# Patient Record
Sex: Male | Born: 1975
Health system: Western US, Academic
[De-identification: ages and names within clinical notes are randomized; demographics above are authoritative.]

## PROBLEM LIST (undated history)

## (undated) DIAGNOSIS — F4310 Post-traumatic stress disorder, unspecified: Secondary | ICD-10-CM

## (undated) HISTORY — PX: HYDROCELE EXCISION / REPAIR: SUR1145

## (undated) HISTORY — PX: EYE SURGERY: SHX253

---

## 1999-10-20 ENCOUNTER — Encounter: Admission: RE | Admit: 1999-10-20 | Discharge: 1999-10-20 | Payer: Self-pay | Admitting: General Surgery

## 1999-10-20 ENCOUNTER — Encounter: Payer: Self-pay | Admitting: General Surgery

## 2003-04-02 ENCOUNTER — Emergency Department (HOSPITAL_COMMUNITY): Admission: EM | Admit: 2003-04-02 | Discharge: 2003-04-02 | Payer: Self-pay | Admitting: Emergency Medicine

## 2010-05-31 ENCOUNTER — Emergency Department (INDEPENDENT_AMBULATORY_CARE_PROVIDER_SITE_OTHER): Payer: Self-pay

## 2010-05-31 ENCOUNTER — Emergency Department (HOSPITAL_BASED_OUTPATIENT_CLINIC_OR_DEPARTMENT_OTHER)
Admission: EM | Admit: 2010-05-31 | Discharge: 2010-05-31 | Disposition: A | Discharge status: Home / Self Care | Payer: Self-pay | Attending: Emergency Medicine | Admitting: Emergency Medicine

## 2010-05-31 DIAGNOSIS — R0602 Shortness of breath: Secondary | ICD-10-CM | POA: Insufficient documentation

## 2010-05-31 DIAGNOSIS — F17200 Nicotine dependence, unspecified, uncomplicated: Secondary | ICD-10-CM | POA: Insufficient documentation

## 2010-05-31 DIAGNOSIS — R112 Nausea with vomiting, unspecified: Secondary | ICD-10-CM | POA: Insufficient documentation

## 2010-05-31 DIAGNOSIS — R059 Cough, unspecified: Secondary | ICD-10-CM | POA: Insufficient documentation

## 2010-05-31 DIAGNOSIS — R05 Cough: Secondary | ICD-10-CM

## 2010-05-31 DIAGNOSIS — R1110 Vomiting, unspecified: Secondary | ICD-10-CM

## 2010-05-31 DIAGNOSIS — R197 Diarrhea, unspecified: Secondary | ICD-10-CM | POA: Insufficient documentation

## 2010-05-31 LAB — BASIC METABOLIC PANEL
BUN: 17 mg/dL (ref 6–23)
CO2: 22 mEq/L (ref 19–32)
Calcium: 9 mg/dL (ref 8.4–10.5)
Chloride: 108 mEq/L (ref 96–112)
Creatinine, Ser: 1.1 mg/dL (ref 0.4–1.5)
GFR calc Af Amer: >60 mL/min (ref >60)
GFR calc non Af Amer: >60 mL/min (ref >60)
Glucose, Bld: 93 mg/dL (ref 70–99)
Potassium: 4.4 mEq/L (ref 3.5–5.1)
Sodium: 145 mEq/L (ref 135–145)

## 2010-12-26 ENCOUNTER — Other Ambulatory Visit: Payer: Self-pay | Admitting: Emergency Medicine

## 2010-12-26 ENCOUNTER — Ambulatory Visit
Admission: RE | Admit: 2010-12-26 | Discharge: 2010-12-26 | Disposition: A | Discharge status: Home / Self Care | Payer: 59 | Source: Ambulatory Visit | Attending: Emergency Medicine | Admitting: Emergency Medicine

## 2010-12-26 DIAGNOSIS — R52 Pain, unspecified: Secondary | ICD-10-CM

## 2011-01-26 ENCOUNTER — Other Ambulatory Visit: Payer: Self-pay | Admitting: Nephrology

## 2011-01-26 ENCOUNTER — Ambulatory Visit
Admission: RE | Admit: 2011-01-26 | Discharge: 2011-01-26 | Disposition: A | Discharge status: Home / Self Care | Payer: 59 | Source: Ambulatory Visit | Attending: Nephrology | Admitting: Nephrology

## 2011-01-26 DIAGNOSIS — N179 Acute kidney failure, unspecified: Secondary | ICD-10-CM

## 2011-08-11 ENCOUNTER — Telehealth: Payer: Self-pay

## 2011-08-14 NOTE — Telephone Encounter (Signed)
Pt wife states that pt had recently requested his medical records, she states that while looking through those records she did not see the pt's out of work notes between the months of November-December, 2012. Pt really needs those out of work notes for his unemployment meeting on Monday. [phone]

## 2011-08-15 NOTE — Telephone Encounter (Signed)
Notified pt that we only have one out of work note and that the other notes should come from specialists since they took him out of work

## 2014-12-26 ENCOUNTER — Emergency Department (HOSPITAL_BASED_OUTPATIENT_CLINIC_OR_DEPARTMENT_OTHER)
Admission: EM | Admit: 2014-12-26 | Discharge: 2014-12-26 | Disposition: A | Discharge status: Home / Self Care | Payer: Self-pay | Attending: Emergency Medicine | Admitting: Emergency Medicine

## 2014-12-26 ENCOUNTER — Encounter (HOSPITAL_BASED_OUTPATIENT_CLINIC_OR_DEPARTMENT_OTHER): Payer: Self-pay | Admitting: *Deleted

## 2014-12-26 ENCOUNTER — Emergency Department (HOSPITAL_BASED_OUTPATIENT_CLINIC_OR_DEPARTMENT_OTHER): Payer: Self-pay

## 2014-12-26 DIAGNOSIS — S56129A Laceration of flexor muscle, fascia and tendon of unspecified finger at forearm level, initial encounter: Secondary | ICD-10-CM

## 2014-12-26 DIAGNOSIS — F17210 Nicotine dependence, cigarettes, uncomplicated: Secondary | ICD-10-CM | POA: Insufficient documentation

## 2014-12-26 DIAGNOSIS — Y9389 Activity, other specified: Secondary | ICD-10-CM | POA: Insufficient documentation

## 2014-12-26 DIAGNOSIS — Y9289 Other specified places as the place of occurrence of the external cause: Secondary | ICD-10-CM | POA: Insufficient documentation

## 2014-12-26 DIAGNOSIS — W540XXA Bitten by dog, initial encounter: Secondary | ICD-10-CM | POA: Insufficient documentation

## 2014-12-26 DIAGNOSIS — Y998 Other external cause status: Secondary | ICD-10-CM | POA: Insufficient documentation

## 2014-12-26 DIAGNOSIS — S62632B Displaced fracture of distal phalanx of right middle finger, initial encounter for open fracture: Secondary | ICD-10-CM

## 2014-12-26 DIAGNOSIS — Z23 Encounter for immunization: Secondary | ICD-10-CM | POA: Insufficient documentation

## 2014-12-26 DIAGNOSIS — S61209A Unspecified open wound of unspecified finger without damage to nail, initial encounter: Secondary | ICD-10-CM

## 2014-12-26 DIAGNOSIS — S62662B Nondisplaced fracture of distal phalanx of right middle finger, initial encounter for open fracture: Secondary | ICD-10-CM | POA: Insufficient documentation

## 2014-12-26 DIAGNOSIS — S66122A Laceration of flexor muscle, fascia and tendon of right middle finger at wrist and hand level, initial encounter: Secondary | ICD-10-CM | POA: Insufficient documentation

## 2014-12-26 DIAGNOSIS — Z8659 Personal history of other mental and behavioral disorders: Secondary | ICD-10-CM | POA: Insufficient documentation

## 2014-12-26 HISTORY — DX: Post-traumatic stress disorder, unspecified: F43.10

## 2014-12-26 LAB — CBC WITH DIFFERENTIAL/PLATELET
BASOS ABS: 0 10*3/uL (ref 0.0–0.1)
BASOS PCT: 0 %
EOS PCT: 1 %
Eosinophils Absolute: 0.1 10*3/uL (ref 0.0–0.7)
HEMATOCRIT: 46.1 % (ref 39.0–52.0)
Hemoglobin: 15.7 g/dL (ref 13.0–17.0)
Lymphocytes Relative: 12 %
Lymphs Abs: 1.9 10*3/uL (ref 0.7–4.0)
MCH: 31 pg (ref 26.0–34.0)
MCHC: 34.1 g/dL (ref 30.0–36.0)
MCV: 90.9 fL (ref 78.0–100.0)
MONO ABS: 1.4 10*3/uL — AB (ref 0.1–1.0)
Monocytes Relative: 9 %
NEUTROS ABS: 12.2 10*3/uL — AB (ref 1.7–7.7)
Neutrophils Relative %: 78 %
PLATELETS: 297 10*3/uL (ref 150–400)
RBC: 5.07 MIL/uL (ref 4.22–5.81)
RDW: 13.4 % (ref 11.5–15.5)
WBC: 15.5 10*3/uL — ABNORMAL HIGH (ref 4.0–10.5)

## 2014-12-26 LAB — BASIC METABOLIC PANEL
ANION GAP: 9 (ref 5–15)
BUN: 11 mg/dL (ref 6–20)
CALCIUM: 9.5 mg/dL (ref 8.9–10.3)
CO2: 23 mmol/L (ref 22–32)
Chloride: 108 mmol/L (ref 101–111)
Creatinine, Ser: 1.18 mg/dL (ref 0.61–1.24)
GFR calc Af Amer: >60 mL/min (ref >60)
GLUCOSE: 93 mg/dL (ref 65–99)
Potassium: 4.2 mmol/L (ref 3.5–5.1)
Sodium: 140 mmol/L (ref 135–145)

## 2014-12-26 MED ORDER — AMOXICILLIN-POT CLAVULANATE 875-125 MG PO TABS: 1.0000 | ORAL_TABLET | Freq: Two times a day (BID) | ORAL | Status: DC

## 2014-12-26 MED ORDER — HYDROCODONE-ACETAMINOPHEN 5-325 MG PO TABS: 1.0000 | ORAL_TABLET | ORAL | Status: AC | PRN

## 2014-12-26 MED ORDER — AMOXICILLIN-POT CLAVULANATE 875-125 MG PO TABS
1.0000 | ORAL_TABLET | Freq: Once | ORAL | Status: DC
  Filled 2014-12-26: qty 1

## 2014-12-26 MED ORDER — MORPHINE SULFATE (PF) 4 MG/ML IV SOLN
8.0000 mg | Freq: Once | INTRAVENOUS | Status: AC
  Administered 2014-12-26: 8 mg via INTRAVENOUS
  Filled 2014-12-26: qty 2

## 2014-12-26 MED ORDER — SODIUM CHLORIDE 0.9 % IV SOLN
3.0000 g | Freq: Once | INTRAVENOUS | Status: AC
  Administered 2014-12-26: 3 g via INTRAVENOUS
  Filled 2014-12-26: qty 3

## 2014-12-26 MED ORDER — TETANUS-DIPHTH-ACELL PERTUSSIS 5-2.5-18.5 LF-MCG/0.5 IM SUSP
0.5000 mL | Freq: Once | INTRAMUSCULAR | Status: AC
  Administered 2014-12-26: 0.5 mL via INTRAMUSCULAR
  Filled 2014-12-26: qty 0.5

## 2014-12-26 NOTE — Discharge Instructions (Signed)
Finger Fracture Fractures of fingers are breaks in the bones of the fingers. There are many types of fractures. There are different ways of treating these fractures. Your health care provider will discuss the best way to treat your fracture. CAUSES Traumatic injury is the main cause of broken fingers. These include:  Injuries while playing sports.  Workplace injuries.  Falls. RISK FACTORS Activities that can increase your risk of finger fractures include:  Sports.  Workplace activities that involve machinery.  A condition called osteoporosis, which can make your bones less dense and cause them to fracture more easily. SIGNS AND SYMPTOMS The main symptoms of a broken finger are pain and swelling within 15 minutes after the injury. Other symptoms include:  Bruising of your finger.  Stiffness of your finger.  Numbness of your finger.  Exposed bones (compound fracture) if the fracture is severe. DIAGNOSIS  The best way to diagnose a broken bone is with X-ray imaging. Additionally, your health care provider will use this X-ray image to evaluate the position of the broken finger bones.  TREATMENT  Finger fractures can be treated with:   Nonreduction--This means the bones are in place. The finger is splinted without changing the positions of the bone pieces. The splint is usually left on for about a week to 10 days. This will depend on your fracture and what your health care provider thinks.  Closed reduction--The bones are put back into position without using surgery. The finger is then splinted.  Open reduction and internal fixation--The fracture site is opened. Then the bone pieces are fixed into place with pins or some type of hardware. This is seldom required. It depends on the severity of the fracture. HOME CARE INSTRUCTIONS   Follow your health care provider's instructions regarding activities, exercises, and physical therapy.  Only take over-the-counter or prescription  medicines for pain, discomfort, or fever as directed by your health care provider. SEEK MEDICAL CARE IF: You have pain or swelling that limits the motion or use of your fingers. SEEK IMMEDIATE MEDICAL CARE IF:  Your finger becomes numb. MAKE SURE YOU:   Understand these instructions.  Will watch your condition.  Will get help right away if you are not doing well or get worse.   This information is not intended to replace advice given to you by your health care provider. Make sure you discuss any questions you have with your health care provider.   Document Released: 05/10/2000 Document Revised: 11/16/2012 Document Reviewed: 09/07/2012 Elsevier Interactive Patient Education 2016 [employer] IT sales professionalAnimal Bite Animal bites can range from mild to serious. An animal bite can result in a scratch on the skin, a deep open cut, a puncture of the skin, a crush injury, or tearing away of the skin or a body part. A small bite from a house pet will usually not cause serious problems. However, some animal bites can become infected or injure a bone or other tissue.  Bites from certain animals can be more dangerous because of the risk of spreading rabies, which is a serious viral infection. This risk is higher with bites from stray animals or wild animals, such as raccoons, foxes, skunks, and bats. Dogs are responsible for most animal bites. Children are bitten more often than adults. SYMPTOMS  Common symptoms of an animal bite include:   Pain.   Bleeding.   Swelling.   Bruising.  DIAGNOSIS  This condition may be diagnosed based on a physical exam and medical history. Your health care provider  will examine the wound and ask for details about the animal and how the bite happened. You may also have tests, such as:   Blood tests to check for infection or to determine if surgery is needed.  X-rays to check for damage to bones or joints.  Culture test. This uses a sample of fluid from the wound to  check for infection. TREATMENT  Treatment varies depending on the location and type of animal bite and your medical history. Treatment may include:   Wound care. This often includes cleaning the wound, flushing the wound with saline solution, and applying a bandage (dressing). Sometimes, the wound is left open to heal because of the high risk of infection. However, in some cases, the wound may be closed with stitches (sutures), staples, skin glue, or adhesive strips.   Antibiotic medicine.   Tetanus shot.   Rabies treatment if the animal could have rabies.  In some cases, bites that have become infected may require IV antibiotics and surgical treatment in the hospital.  HOME CARE INSTRUCTIONS Wound Care  Follow instructions from your health care provider about how to take care of your wound. Make sure you:  Wash your hands with soap and water before you change your dressing. If soap and water are not available, use hand sanitizer.  Change your dressing as told by your health care provider.  Leave sutures, skin glue, or adhesive strips in place. These skin closures may need to be in place for 2 weeks or longer. If adhesive strip edges start to loosen and curl up, you may trim the loose edges. Do not remove adhesive strips completely unless your health care provider tells you to do that.  Check your wound every day for signs of infection. Watch for:   Increasing redness, swelling, or pain.   Fluid, blood, or pus.  General Instructions  Take or apply over-the-counter and prescription medicines only as told by your health care provider.   If you were prescribed an antibiotic, take or apply it as told by your health care provider. Do not stop using the antibiotic even if your condition improves.   Keep the injured area raised (elevated) above the level of your heart while you are sitting or lying down, if this is possible.   If directed, apply ice to the injured area.    Put ice in a plastic bag.   Place a towel between your skin and the bag.   Leave the ice on for 20 minutes, 2-3 times per day.   Keep all follow-up visits as told by your health care provider. This is important.  SEEK MEDICAL CARE IF:  You have increasing redness, swelling, or pain at the site of your wound.   You have a general feeling of sickness (malaise).   You feel nauseous or you vomit.   You have pain that does not get better.  SEEK IMMEDIATE MEDICAL CARE IF:  You have a red streak extending away from your wound.   You have fluid, blood, or pus coming from your wound.   You have a fever or chills.   You have trouble moving your injured area.   You have numbness or tingling extending beyond the wound.   This information is not intended to replace advice given to you by your health care provider. Make sure you discuss any questions you have with your health care provider.   Document Released: 10/14/2010 Document Revised: 10/17/2014 Document Reviewed: 06/13/2014 Elsevier Interactive Patient Education 2016  Elsevier Inc. ° °

## 2014-12-26 NOTE — ED Provider Notes (Signed)
CSN: [id number]     Arrival date & time 12/26/14  1504 History   First MD Initiated Contact with Patient 12/26/14 1538     Chief Complaint  Patient presents with  . Animal Bite     (Consider location/radiation/quality/duration/timing/severity/associated sxs/prior Treatment) Patient is a 39 y.o. male presenting with animal bite. The history is provided by the patient.  Animal Bite Contact animal:  Dog Location:  Finger Finger injury location:  R index finger, R long finger and R ring finger Time since incident:  1 hour Pain details:    Quality:  Aching   Severity:  Moderate   Timing:  Constant   Progression:  Unchanged Incident location:  Home Provoked: provoked   Notifications:  None Animal's rabies vaccination status:  Up to date Animal in possession: yes   Tetanus status: 6+ years. Relieved by:  Nothing Worsened by:  Nothing tried Associated symptoms: no numbness and no swelling     Past Medical History  Diagnosis Date  . PTSD (post-traumatic stress disorder)    Past Surgical History  Procedure Laterality Date  . Eye surgery    . Hydrocele excision / repair     No family history on file. Social History  Substance Use Topics  . Smoking status: Current Every Day Smoker -- 0.50 packs/day    Types: Cigarettes  . Smokeless tobacco: Never Used  . Alcohol Use: No    Review of Systems  Skin: Positive for wound.  Neurological: Negative for numbness.  All other systems reviewed and are negative.     Allergies  Review of patient's allergies indicates no known allergies.  Home Medications   Prior to Admission medications   Not on File   BP 119/70 mmHg  Pulse 89  Temp(Src) 97.6 F (36.4 C) (Oral)  Resp 18  Ht 5\' 11"  (1.803 m)  Wt 165 lb (74.844 kg)  BMI 23.02 kg/m2  SpO2 100% Physical Exam  Constitutional: He is oriented to person, place, and time. He appears well-developed and well-nourished. No distress.  HENT:  Head: Normocephalic and atraumatic.   Eyes: Conjunctivae are normal.  Neck: Neck supple. No tracheal deviation present.  Cardiovascular: Normal rate and regular rhythm.   Pulmonary/Chest: Effort normal. No respiratory distress.  Abdominal: Soft. He exhibits no distension.  Musculoskeletal:       Right hand: He exhibits decreased range of motion (unable to flex at DIP of 3rd digit), tenderness and laceration ( ulnar ring, long, and index finger at DIP. palmar portion of DIP and distal phalanx of long finger).  Neurological: He is alert and oriented to person, place, and time.  Skin: Skin is warm and dry.  Psychiatric: He has a normal mood and affect.  Vitals reviewed.     ED Course  Procedures (including critical care time) Labs Review Labs Reviewed  CBC WITH DIFFERENTIAL/PLATELET - Abnormal; Notable for the following:    WBC 15.5 (*)    Neutro Abs 12.2 (*)    Monocytes Absolute 1.4 (*)    All other components within normal limits  BASIC METABOLIC PANEL    Imaging Review Dg Hand Complete Right  12/26/2014  CLINICAL DATA:  Dog bite. EXAM: RIGHT HAND - COMPLETE 3+ VIEW COMPARISON:  None. FINDINGS: Nondisplaced transverse fracture is seen involving the distal portion of the third distal phalanx. Soft tissue irregularity is noted in this area consistent with laceration. No radiopaque foreign body is noted. Joint spaces are intact. IMPRESSION: Nondisplaced fracture involving the distal portion of the  third distal phalanx. There is associated soft tissue irregularity consistent with laceration. No radiopaque foreign body is noted. Electronically Signed   By: Lupita Raider, M.D.   On: 12/26/2014 16:24   I have personally reviewed and evaluated these images and lab results as part of my medical decision-making.   EKG Interpretation None      MDM   Final diagnoses:  Open fracture of distal phalanx of third finger of right hand, initial encounter  Dog bite  Flexor tendon laceration, finger, open wound, initial  encounter    39 year old male presents after sustaining a dog bite to his right index, long, and ring fingers while trying to separate 2 of his dogs that were in a fight. The vaccinations are up-to-date and they are in possession. Patient's last tetanus was greater than 6 years ago and unsure about last administration so given a booster today. Lacerations over the pad of the right long finger and overlying the DIP and just proximal to this with deficit of DIP flexion is concerning for possible flexor tendon involvement. Given dose of IV Unasyn, patient is NPO, consult to hand surgery.   Distal phalanx fracture on third digit noted on plain film. This information was discussed with Dr. Izora Ribas who had available uploaded images in the electronic medical record. He recommended not closing the wounds and close follow-up with him in clinic on antibiotic therapy. The patient was given analgesia and Augmentin for home and recommended to follow up with Dr. Izora Ribas as soon as possible. XR notable for underlying distal phalanx fracture and Dr Izora Ribas was informed of this, no change in management recommended.    Lyndal Pulley, MD 12/27/14 0000

## 2014-12-26 NOTE — ED Notes (Signed)
MD at bedside. 

## 2014-12-26 NOTE — ED Notes (Signed)
Patient transported to x-ray. ?

## 2014-12-26 NOTE — ED Notes (Signed)
Patient refuses to complete the animal control reporting form for our registration staff, Deanne.

## 2014-12-26 NOTE — ED Notes (Signed)
Reports breaking dog fight between 2 english bull dogs- bites to right index, middle and ring fingers- reports had to pull his hand out of dogs mouth and middle finger tip is "bulging out"- pt's own dogs with vaccines UTD- dressing applied prior to arrival reinforced but not removed in triage

## 2015-04-19 ENCOUNTER — Encounter (HOSPITAL_BASED_OUTPATIENT_CLINIC_OR_DEPARTMENT_OTHER): Payer: Self-pay | Admitting: *Deleted

## 2015-04-19 ENCOUNTER — Emergency Department (HOSPITAL_BASED_OUTPATIENT_CLINIC_OR_DEPARTMENT_OTHER)
Admission: EM | Admit: 2015-04-19 | Discharge: 2015-04-19 | Disposition: A | Discharge status: Home / Self Care | Payer: Non-veteran care | Attending: Emergency Medicine | Admitting: Emergency Medicine

## 2015-04-19 DIAGNOSIS — F17210 Nicotine dependence, cigarettes, uncomplicated: Secondary | ICD-10-CM | POA: Insufficient documentation

## 2015-04-19 DIAGNOSIS — Z8659 Personal history of other mental and behavioral disorders: Secondary | ICD-10-CM | POA: Insufficient documentation

## 2015-04-19 DIAGNOSIS — Z202 Contact with and (suspected) exposure to infections with a predominantly sexual mode of transmission: Secondary | ICD-10-CM | POA: Insufficient documentation

## 2015-04-19 MED ORDER — CEFTRIAXONE SODIUM 250 MG IJ SOLR
250.0000 mg | Freq: Once | INTRAMUSCULAR | Status: AC
  Administered 2015-04-19: 250 mg via INTRAMUSCULAR
  Filled 2015-04-19: qty 250

## 2015-04-19 MED ORDER — AZITHROMYCIN 250 MG PO TABS
1000.0000 mg | ORAL_TABLET | Freq: Once | ORAL | Status: AC
  Administered 2015-04-19: 1000 mg via ORAL
  Filled 2015-04-19: qty 4

## 2015-04-19 NOTE — ED Notes (Signed)
He was seen here 4 days ago. He was called and told to return to the ED for STD treatment.

## 2015-04-19 NOTE — Discharge Instructions (Signed)
Sexually Transmitted Disease Results for STD's will come back in a couple of days.  The hospital will call to inform you if they are positive.  A sexually transmitted disease (STD) is a disease or infection that may be passed (transmitted) from person to person, usually during sexual activity. This may happen by way of saliva, semen, blood, vaginal mucus, or urine. Common STDs include:  Gonorrhea.  Chlamydia.  Syphilis.  HIV and AIDS.  Genital herpes.  Hepatitis B and C.  Trichomonas.  Human papillomavirus (HPV).  Pubic lice.  Scabies.  Mites.  Bacterial vaginosis. WHAT ARE CAUSES OF STDs? An STD may be caused by bacteria, a virus, or parasites. STDs are often transmitted during sexual activity if one person is infected. However, they may also be transmitted through nonsexual means. STDs may be transmitted after:   Sexual intercourse with an infected person.  Sharing sex toys with an infected person.  Sharing needles with an infected person or using unclean piercing or tattoo needles.  Having intimate contact with the genitals, mouth, or rectal areas of an infected person.  Exposure to infected fluids during birth. WHAT ARE THE SIGNS AND SYMPTOMS OF STDs? Different STDs have different symptoms. Some people may not have any symptoms. If symptoms are present, they may include:  Painful or bloody urination.  Pain in the pelvis, abdomen, vagina, anus, throat, or eyes.  A skin rash, itching, or irritation.  Growths, ulcerations, blisters, or sores in the genital and anal areas.  Abnormal vaginal discharge with or without bad odor.  Penile discharge in men.  Fever.  Pain or bleeding during sexual intercourse.  Swollen glands in the groin area.  Yellow skin and eyes (jaundice). This is seen with hepatitis.  Swollen testicles.  Infertility.  Sores and blisters in the mouth. HOW ARE STDs DIAGNOSED? To make a diagnosis, your health care provider may:  Take  a medical history.  Perform a physical exam.  Take a sample of any discharge to examine.  Swab the throat, cervix, opening to the penis, rectum, or vagina for testing.  Test a sample of your first morning urine.  Perform blood tests.  Perform a Pap test, if this applies.  Perform a colposcopy.  Perform a laparoscopy. HOW ARE STDs TREATED? Treatment depends on the STD. Some STDs may be treated but not cured.  Chlamydia, gonorrhea, trichomonas, and syphilis can be cured with antibiotic medicine.  Genital herpes, hepatitis, and HIV can be treated, but not cured, with prescribed medicines. The medicines lessen symptoms.  Genital warts from HPV can be treated with medicine or by freezing, burning (electrocautery), or surgery. Warts may come back.  HPV cannot be cured with medicine or surgery. However, abnormal areas may be removed from the cervix, vagina, or vulva.  If your diagnosis is confirmed, your recent sexual partners need treatment. This is true even if they are symptom-free or have a negative culture or evaluation. They should not have sex until their health care providers say it is okay.  Your health care provider may test you for infection again 3 months after treatment. HOW CAN I REDUCE MY RISK OF GETTING AN STD? Take these steps to reduce your risk of getting an STD:  Use latex condoms, dental dams, and water-soluble lubricants during sexual activity. Do not use petroleum jelly or oils.  Avoid having multiple sex partners.  Do not have sex with someone who has other sex partners  Do not have sex with anyone you do not know  or who is at high risk for an STD.  Avoid risky sex practices that can break your skin.  Do not have sex if you have open sores on your mouth or skin.  Avoid drinking too much alcohol or taking illegal drugs. Alcohol and drugs can affect your judgment and put you in a vulnerable position.  Avoid engaging in oral and anal sex acts.  Get  vaccinated for HPV and hepatitis. If you have not received these vaccines in the past, talk to your health care provider about whether one or both might be right for you.  If you are at risk of being infected with HIV, it is recommended that you take a prescription medicine daily to prevent HIV infection. This is called pre-exposure prophylaxis (PrEP). You are considered at risk if:  You are a man who has sex with other men (MSM).  You are a heterosexual man or woman and are sexually active with more than one partner.  You take drugs by injection.  You are sexually active with a partner who has HIV.  Talk with your health care provider about whether you are at high risk of being infected with HIV. If you choose to begin PrEP, you should first be tested for HIV. You should then be tested every 3 months for as long as you are taking PrEP. WHAT SHOULD I DO IF I THINK I HAVE AN STD?  See your health care provider.  Tell your sexual partner(s). They should be tested and treated for any STDs.  Do not have sex until your health care provider says it is okay. WHEN SHOULD I GET IMMEDIATE MEDICAL CARE? Contact your health care provider right away if:   You have severe abdominal pain.  You are a man and notice swelling or pain in your testicles.  You are a woman and notice swelling or pain in your vagina.   This information is not intended to replace advice given to you by your health care provider. Make sure you discuss any questions you have with your health care provider.   Document Released: 04/18/2002 Document Revised: 02/16/2014 Document Reviewed: 08/16/2012 Elsevier Interactive Patient Education [employer] Elsevier Inc.

## 2015-04-19 NOTE — ED Provider Notes (Signed)
CSN: [id number]     Arrival date & time 04/19/15  1432 History   First MD Initiated Contact with Patient 04/19/15 1536     Chief Complaint  Patient presents with  . Exposure to STD   (Consider location/radiation/quality/duration/timing/severity/associated sxs/prior Treatment) Patient is a 40 y.o. male presenting with STD exposure. The history is provided by the patient. No language interpreter was used.  Exposure to STD  Mr. Littler is a 40 year old male with no significant past medical history who was informed by his girlfriend who is with him that she tested positive for gonorrhea. He is here for treatment but states he does not have symptoms. He states he has only had 1 partner who is with him and tested positive for gonorrhea. He denies any abdominal pain, nausea, vomiting, testicular pain, penile discharge or pain, dysuria, hematuria, or urinary frequency, genital sores.    Past Medical History  Diagnosis Date  . PTSD (post-traumatic stress disorder)    Past Surgical History  Procedure Laterality Date  . Eye surgery    . Hydrocele excision / repair     No family history on file. Social History  Substance Use Topics  . Smoking status: Current Every Day Smoker -- 0.50 packs/day    Types: Cigarettes  . Smokeless tobacco: Never Used  . Alcohol Use: No    Review of Systems  Genitourinary: Negative for urgency, hematuria, discharge, penile swelling, scrotal swelling, genital sores, penile pain and testicular pain.  All other systems reviewed and are negative.     Allergies  Review of patient's allergies indicates no known allergies.  Home Medications   Prior to Admission medications   Medication Sig Start Date End Date Taking? Authorizing Provider  amoxicillin-clavulanate (AUGMENTIN) 875-125 MG tablet Take 1 tablet by mouth every 12 (twelve) hours. 12/26/14   Lyndal Pulley, MD  HYDROcodone-acetaminophen (NORCO/VICODIN) 5-325 MG tablet Take 1 tablet by mouth every 4 (four)  hours as needed. 12/26/14   Lyndal Pulley, MD   BP 128/93 mmHg  Pulse 86  Temp(Src) 98.6 F (37 C) (Oral)  Resp 20  Ht  (1.803 m)  Wt 72.576 kg  BMI 22.33 kg/m2  SpO2 99% Physical Exam  Constitutional: He is oriented to person, place, and time. He appears well-developed and well-nourished.  HENT:  Head: Normocephalic and atraumatic.  Eyes: Conjunctivae are normal.  Neck: Normal range of motion. Neck supple.  Cardiovascular: Normal rate.   Pulmonary/Chest: Effort normal. No respiratory distress.  Abdominal: Soft. There is no tenderness.  Musculoskeletal: Normal range of motion.  Neurological: He is alert and oriented to person, place, and time.  Skin: Skin is warm and dry.  Nursing note and vitals reviewed.   ED Course  Procedures (including critical care time) Labs Review Labs Reviewed  GC/CHLAMYDIA PROBE AMP ([place]) NOT AT [facility]    Imaging Review No results found.    EKG Interpretation None      MDM   Final diagnoses:  STD exposure   Patient presents for treatment due to STD exposure. Specifically gonorrhea. His partner is with him at this time is being treated also. He denies any symptoms. His exam is normal. He refused GU exam. Return precautions were discussed as well as follow-up. GC/chlamydia is pending. I discussed with him that the hospital would inform him if his results are positive. Patient agrees with plan. Medications  cefTRIAXone (ROCEPHIN) injection 250 mg (250 mg Intramuscular Given 04/19/15 1556)  azithromycin (ZITHROMAX) tablet 1,000 mg (1,000 mg Oral Given  04/19/15 1556)       Catha GosselinHanna Patel-Mills, PA-C 04/19/15 [id number]  Linwood DibblesJon Knapp, MD 04/19/15 [phone]

## 2015-04-22 LAB — GC/CHLAMYDIA PROBE AMP (~~LOC~~) NOT AT ARMC
CHLAMYDIA, DNA PROBE: NEGATIVE
Neisseria Gonorrhea: NEGATIVE

## 2015-10-23 DIAGNOSIS — H35712 Central serous chorioretinopathy, left eye: Secondary | ICD-10-CM

## 2016-10-06 ENCOUNTER — Ambulatory Visit: Payer: BLUE CROSS/BLUE SHIELD

## 2016-10-06 DIAGNOSIS — R05 Cough: Secondary | ICD-10-CM

## 2016-11-12 ENCOUNTER — Emergency Department (HOSPITAL_COMMUNITY)
Admission: EM | Admit: 2016-11-12 | Discharge: 2016-11-12 | Attending: Emergency Medicine | Admitting: Emergency Medicine

## 2016-11-12 DIAGNOSIS — R110 Nausea: Secondary | ICD-10-CM

## 2016-11-12 DIAGNOSIS — Z79899 Other long term (current) drug therapy: Secondary | ICD-10-CM | POA: Diagnosis not present

## 2016-11-12 DIAGNOSIS — F419 Anxiety disorder, unspecified: Secondary | ICD-10-CM | POA: Diagnosis not present

## 2016-11-12 DIAGNOSIS — F17210 Nicotine dependence, cigarettes, uncomplicated: Secondary | ICD-10-CM | POA: Insufficient documentation

## 2016-11-12 LAB — CBC
HCT: 40.7 % (ref 39.0–52.0)
Hemoglobin: 14.4 g/dL (ref 13.0–17.0)
MCH: 30.7 pg (ref 26.0–34.0)
MCHC: 35.4 g/dL (ref 30.0–36.0)
MCV: 86.8 fL (ref 78.0–100.0)
PLATELETS: 253 10*3/uL (ref 150–400)
RBC: 4.69 MIL/uL (ref 4.22–5.81)
RDW: 13.3 % (ref 11.5–15.5)
WBC: 14.6 10*3/uL — AB (ref 4.0–10.5)

## 2016-11-12 LAB — COMPREHENSIVE METABOLIC PANEL
ALK PHOS: 108 U/L (ref 38–126)
ALT: 13 U/L — ABNORMAL LOW (ref 17–63)
AST: 20 U/L (ref 15–41)
Albumin: 4.2 g/dL (ref 3.5–5.0)
Anion gap: 12 (ref 5–15)
BILIRUBIN TOTAL: 0.7 mg/dL (ref 0.3–1.2)
BUN: 14 mg/dL (ref 6–20)
CALCIUM: 9.8 mg/dL (ref 8.9–10.3)
CO2: 18 mmol/L — ABNORMAL LOW (ref 22–32)
Chloride: 109 mmol/L (ref 101–111)
Creatinine, Ser: 1.25 mg/dL — ABNORMAL HIGH (ref 0.61–1.24)
Glucose, Bld: 120 mg/dL — ABNORMAL HIGH (ref 65–99)
Potassium: 3.5 mmol/L (ref 3.5–5.1)
Sodium: 139 mmol/L (ref 135–145)
Total Protein: 7.3 g/dL (ref 6.5–8.1)

## 2016-11-12 LAB — LIPASE, BLOOD: LIPASE: 33 U/L (ref 11–51)

## 2016-11-12 MED ORDER — ONDANSETRON 4 MG PO TBDP
4.0000 mg | ORAL_TABLET | Freq: Once | ORAL | Status: AC
  Administered 2016-11-12: 4 mg via ORAL
  Filled 2016-11-12: qty 1

## 2016-11-12 NOTE — ED Notes (Signed)
Administered Zofran 4 mg ODT prior to discharge for complaints of nausea

## 2016-11-12 NOTE — ED Triage Notes (Signed)
Pt is in POLICE custody
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
## 2018-02-24 ENCOUNTER — Ambulatory Visit: Payer: BLUE CROSS/BLUE SHIELD

## 2018-02-24 DIAGNOSIS — J069 Acute upper respiratory infection, unspecified: Secondary | ICD-10-CM

## 2018-02-24 NOTE — Progress Notes
Name: Russell Wiggins  Medical Record Number: [id number]  Date of Service: 02/24/2018    Chief Complaint:   Chief Complaint   Patient presents with    Cough       History of Present Illness: Russell Wiggins is a 43 y.o. male who presents with mild dry cough for about 1 week, resolving, initially also with sore throat and congestion, no fevers. Also with morbid obesity, has gained 45 lbs in 2 years, would like to see dietician.        Review of Systems:   Gen - no acute distress  CV - no chest pain, no palpitations  Resp - no shortness of breath, no dyspnea  Skin - no rash    Physical Exam:   Vitals:   Last Recorded Vital Signs:    02/24/18 1015   BP: 128/83   Pulse: 89   Resp: 18   Temp: 36.9 C (98.5 F)   SpO2: 99%        General: No acute distress, obese  HEENT: Normocephalic, atraumatic, PERRL, conjunctiva/corneas clear, EOM's intact bilaterally, moist mucous membranes, no oral ulcerations/lesions.  Lungs: Clear to auscultation bilaterally, respirations unlabored, no rales/rhonchi/wheezing  Heart: Regular rate and rhythm, S1, S2 normal, no murmurs, rubs or gallops  Neurologic: No focal neurologic deficits    Assessment and Plan:   The patient is a 43 y.o. year old male with:    Diagnoses and all orders for this visit:    Acute URI  -resolving, likely viral    Morbid (severe) obesity due to excess calories (HCC/RAF)  -     Dietician-([facility]) [facility]     -work on [employer] loss    Marcelene Butte, MD  [facility]  [facility]

## 2018-03-16 ENCOUNTER — Ambulatory Visit: Payer: BLUE CROSS/BLUE SHIELD

## 2018-04-25 ENCOUNTER — Ambulatory Visit: Payer: BLUE CROSS/BLUE SHIELD

## 2018-05-02 ENCOUNTER — Telehealth: Payer: BLUE CROSS/BLUE SHIELD

## 2018-05-02 DIAGNOSIS — R3911 Hesitancy of micturition: Secondary | ICD-10-CM

## 2018-05-02 NOTE — Progress Notes
Name: Brandonn Fleury  Medical Record Number: [id number]  Date of Service: 05/02/2018    Chief Complaint: urinary hesitancy .      History of Present Illness: Jeanclaude Quade is a 43 y.o. male who presents with urinary hesitancy and mild pressure in the genital area for 2 days that has already resolved. No dysuria, no low back pain, no fevers. No hx of prostate issues.        Review of Systems:   Gen - no acute distress  CV - no chest pain, no palpitations  Resp - no shortness of breath, no dyspnea  Skin - no rash    Physical Exam:   Vitals: There were no vitals filed for this visit.     General: No acute distress,      Assessment and Plan:   The patient is a 43 y.o. year old male with:    Diagnoses and all orders for this visit:    Urinary hesitancy  -for 2 days over the weekend, resolved, monitor, f/u in person if symptoms return.     Marcelene Butte, MD  [facility]  [facility]

## 2019-05-17 ENCOUNTER — Ambulatory Visit: Payer: BLUE CROSS/BLUE SHIELD

## 2019-05-17 DIAGNOSIS — Z23 Encounter for immunization: Secondary | ICD-10-CM

## 2019-06-14 ENCOUNTER — Telehealth: Payer: BLUE CROSS/BLUE SHIELD

## 2019-06-14 NOTE — Telephone Encounter
Called pt no answer, lvm informing pt derm referral has been renewed

## 2019-06-14 NOTE — Telephone Encounter
Referral Request  Pt has appt 06/16/2019 please expedite.     1) Patient is requesting a referral to: Dermatology      Specific location? [facility]   Particular MD in mind? Dr Lavera Guise    2) The issue (diagnosis, symptoms): Skin Screening    3) Has patient been seen by their doctor for this issue? Y, previous referral expired     4) Was an appointment offered? Y  5) Patient's last office visit: 03.23.2020    Patient has been notified of the 24-48 hour turnaround time.

## 2019-06-30 ENCOUNTER — Ambulatory Visit: Payer: BLUE CROSS/BLUE SHIELD

## 2019-07-21 ENCOUNTER — Ambulatory Visit: Payer: BLUE CROSS/BLUE SHIELD

## 2019-07-28 ENCOUNTER — Ambulatory Visit: Payer: BLUE CROSS/BLUE SHIELD

## 2019-08-10 ENCOUNTER — Ambulatory Visit: Payer: BLUE CROSS/BLUE SHIELD

## 2019-08-17 ENCOUNTER — Ambulatory Visit: Payer: BLUE CROSS/BLUE SHIELD

## 2019-08-29 ENCOUNTER — Ambulatory Visit: Payer: BLUE CROSS/BLUE SHIELD

## 2019-09-12 ENCOUNTER — Ambulatory Visit: Payer: BLUE CROSS/BLUE SHIELD

## 2019-10-03 ENCOUNTER — Ambulatory Visit: Payer: BLUE CROSS/BLUE SHIELD

## 2019-10-03 DIAGNOSIS — Z0000 Encounter for general adult medical examination without abnormal findings: Secondary | ICD-10-CM

## 2019-10-03 DIAGNOSIS — Z1211 Encounter for screening for malignant neoplasm of colon: Secondary | ICD-10-CM

## 2019-10-03 NOTE — H&P
[facility]    Comprehensive History & Physical Exam        PATIENT: Russell Wiggins  MRN: [id number]  DOB: 1975/09/21  DATE OF SERVICE: 10/03/2019    PRIMARY CARE PROVIDER: Marcelene Butte, MD    Subjective:     Russell Wiggins is a 45 y.o. male is here today for his annual physical exam.    Issues addressed today:    1. Chronic issues stable.    Routine Health Maintenance:  ??? Immunizations: utd  ??? Colonoscopy: referral today     Past Medical History:   Diagnosis Date   ??? Empyema (HCC/RAF)    ??? Obesity      Past Surgical History:   Procedure Laterality Date   ??? HIP FRACTURE SURGERY Right    ??? LAPAROSCOPIC GASTRIC BANDING     ??? LUNG SURGERY       Family History   Problem Relation Age of Onset   ??? Lymphoma Father    ??? Obesity Father    ??? Osteoporosis Paternal Aunt    ??? Obesity Paternal Uncle    ??? Diabetes type II Maternal Grandfather    ??? Parkinsonism Paternal Grandfather      Social History     Socioeconomic History   ??? Marital status: Married     Spouse name: Not on file   ??? Number of children: Not on file   ??? Years of education: Not on file   ??? Highest education level: Not on file   Occupational History   ??? Not on file   Social Needs   ??? Financial resource strain: Not on file   ??? Food insecurity     Worry: Not on file     Inability: Not on file   ??? Transportation needs     Medical: Not on file     Non-medical: Not on file   Tobacco Use   ??? Smoking status: Never Smoker   ??? Smokeless tobacco: Never Used   Substance and Sexual Activity   ??? Alcohol use: No     Alcohol/week: 0.0 oz   ??? Drug use: No   ??? Sexual activity: Yes     Partners: Female   Lifestyle   ??? Physical activity     Days per week: Not on file     Minutes per session: Not on file   ??? Stress: Not on file   Relationships   ??? Social Wellsite geologist on phone: Not on file     Gets together: Not on file     Attends religious service: Not on file     Active member of club or organization: Not on file     Attends meetings of clubs or organizations: Not on file     Relationship status: Not on file   Other Topics Concern   ??? Do you exercise at least a day, 3 or more days a week? No   ??? Types of Exercise? (List in Comments) Not Asked   ??? Do you follow a special diet? Not Asked   ??? Vegan? Not Asked   ??? Vegetarian? Not Asked   ??? Pescatarian? Not Asked   ??? Lactose Free? Not Asked   ??? Gluten Free? Not Asked   ??? Omnivore? Not Asked   Social History Narrative   ??? Not on file     No outpatient medications prior to visit.     No facility-administered medications prior  to visit.      No Known Allergies    Review of Systems:   Constitutional: Denies fever, chills, sweats, generalized weakness.  Integumentary: Denies rashes, lesions, pruritis  Eyes: Denies vision changes, eye pain.   ENMT: Denies hearing loss, ear pain, mouth sores/pain, sore throat  Cardiovascular: Denies chest pain, SOB, DOE, palpitations,  Respiratory: Denies SOB, cough, sputum production, wheezing.  Gastrointestinal: Denies nausea, vomiting, diarrhea, constipation, abdominal pain  Genitourinary: Denies dysuria, frequency, urgency, incontinence, Denies vaginal/penile discharge.  Musculoskeletal: Denies arthralgias, myalgias, swelling of joints.  Neurologic: Denies headache, paresthesias, memory problems, dizziness/vertigo.  Psychiatric: Denies anxiety, depression, change in sleep patterns.  Endocrinologic: Denies hot/cold intolerance, polydipsia, polyuria, polyphagia.    Objective:     Vitals:  BP 105/74  ~ Pulse 92  ~ Temp 36.4 ???C (97.6 ???F) (Tympanic)  ~ Resp 18  ~ Ht 5' 6'' (1.676 m)  ~ Wt (!) 295 lb 6.4 oz (134 kg)  ~ SpO2 98%  ~ BMI 47.68 kg/m???    General:   alert and cooperative    Normocephalic, without obvious abnormality, atraumatic    Skin:   Skin color, texture, turgor normal. No rashes or lesions   Eyes:   conjunctivae/corneas clear. PERRL, EOM's intact.   Ears:   normal TM's and external ear canals both ears   Nose:  Nares normal. Mucosa normal. No drainage. Throat:  oropharynx clear   Mouth:   No perioral or gingival cyanosis or lesions.  Tongue is normal in appearance.   Neck:  no adenopathy, supple, symmetrical, trachea midline and thyroid not enlarged, symmetric, no tenderness/mass/nodules   Lungs:   clear to auscultation bilaterally, normal respiratory effort   Heart:   regular rate and rhythm, S1, S2 normal, no murmur, click, rub or gallop   Abdomen:   soft, non-tender, no masses,  no organomegaly       Musculoskeletal:  Full range of motion of all extremities   Extremities:  extremities normal, no cyanosis or edema   Lymph Nodes:  cervical and supraclavicular nodes normal.   Neurologic:   normal strength and tone. Normal coordination and gait   Psychiatric:   mood and affect are within normal limits     Assessment & Plan:     Russell Wiggins is a 44 y.o. male here for his annual physical exam.  Today we addressed the following issues:    Diagnoses and all orders for this visit:    Physical exam  -     CBC; Future  -     Comprehensive Metabolic Panel; Future  -     HIV-1/2 Ag/Ab 4th Generation with Reflex Confirmation; Future  -     TSH with reflex FT4, FT3; Future  -     HCV Antibody Screen; Future    Colon cancer screening  -     **[place] [facility] REFERRAL TO GASTROENTEROLOGY    Central serous chorioretinopathy of left eye  -     [facility] Referral to Ophthalmology    Morbid (severe) obesity due to excess calories (HCC/RAF)  -     Lipid Panel; Future  -     Cancel: Referral to Surgery, Bariatric  -     [facility] Referral to Surgery General        #Routine Health Maintenance:  -Immunizations: utd  -Colonoscopy: referral       Marcelene Butte, MD  Family Medicine  [facility]  [facility]

## 2019-10-04 LAB — Comprehensive Metabolic Panel
POTASSIUM: 4.8 mmol/L (ref 3.6–5.3)
UREA NITROGEN: 11 mg/dL (ref 7–22)

## 2019-10-04 LAB — HCV Ab Screen: HCV ANTIBODY SCREEN: NONREACTIVE

## 2019-10-04 LAB — CBC: NUCLEATED RBC%, AUTOMATED: 0 (ref 143–398)

## 2019-10-04 LAB — TSH with reflex FT4, FT3: TSH: 2.2 u[IU]/mL (ref 0.3–4.7)

## 2019-10-04 LAB — Lipid Panel: NON-HDL,CHOLESTEROL,CALC: 109 mg/dL (ref <130)

## 2019-10-04 LAB — HIV-1/2 Ag/Ab 4th Generation with Reflex Confirmation: HIV-1/2 AG/AB 4TH GENERATION WITH REFLEX CONFIRMATION: NONREACTIVE

## 2019-12-24 ENCOUNTER — Institutional Professional Consult (permissible substitution): Payer: BLUE CROSS/BLUE SHIELD

## 2019-12-24 DIAGNOSIS — Z23 Encounter for immunization: Secondary | ICD-10-CM

## 2020-02-14 ENCOUNTER — Ambulatory Visit: Payer: BLUE CROSS/BLUE SHIELD

## 2020-02-14 DIAGNOSIS — M79671 Pain in right foot: Secondary | ICD-10-CM

## 2020-02-14 DIAGNOSIS — S76912A Strain of unspecified muscles, fascia and tendons at thigh level, left thigh, initial encounter: Secondary | ICD-10-CM

## 2020-02-15 ENCOUNTER — Ambulatory Visit: Payer: BLUE CROSS/BLUE SHIELD

## 2020-02-15 NOTE — Progress Notes
Name: Russell Wiggins  Medical Record Number: [id number]  Date of Service: 02/14/2020    Chief Complaint:   Chief Complaint   Patient presents with    Leg Injury     left leg  x 1 week    elevated heart rate    Arthritis     rt big toe x on and off for 3-4 months       History of Present Illness: Russell Wiggins is a 45 y.o. male who presents with multiple issues:  1. L leg injury a few weeks ago after he planted and twisted it, getting better, able to walk ok, has not taken any nsaids.  2. Rt big toe arthritis, has seen podiatry before, needs new referral  3. Obesity: pt considering bariatric surgery again, already has referral    Past Medical History:   Diagnosis Date    Empyema (HCC/RAF)     Obesity             Review of Systems:   Gen - no acute distress  CV - no chest pain, no palpitations  Resp - no shortness of breath, no dyspnea  Skin - no rash    Physical Exam:   Vitals:   Last Recorded Vital Signs:    02/14/20 1543   BP: 119/82   Pulse: (!) 100   Resp: 18   Temp: (!) 35.9 C (96.7 F)        General: No acute distress,  HEENT: Normocephalic, atraumatic, PERRL, conjunctiva/corneas clear, EOM's intact bilaterally  Lungs: Clear to auscultation bilaterally, respirations unlabored, no rales/rhonchi/wheezing  Heart: Regular rate and rhythm, S1, S2 normal, no murmurs, rubs or gallops  L leg: normal exam  Neurologic: No focal neurologic deficits    Assessment and Plan:   The patient is a 45 y.o. year old male with:    Diagnoses and all orders for this visit:    Muscle strain of left thigh, initial encounter  -nsaids, prn, resolving     Right foot pain  -     [facility] Referral to Podiatry    Morbid (severe) obesity due to excess calories (HCC/RAF)  -f/u with [facility]    Russell Butte, MD  [facility]  [facility]

## 2020-03-26 ENCOUNTER — Telehealth: Payer: BLUE CROSS/BLUE SHIELD

## 2020-03-26 NOTE — Telephone Encounter
Left vm notifying pt apt time/date change

## 2020-04-02 ENCOUNTER — Ambulatory Visit: Payer: BLUE CROSS/BLUE SHIELD | Attending: Gastroenterology

## 2020-04-17 ENCOUNTER — Ambulatory Visit: Payer: BLUE CROSS/BLUE SHIELD | Attending: Gastroenterology

## 2020-04-24 ENCOUNTER — Ambulatory Visit: Payer: BLUE CROSS/BLUE SHIELD | Attending: Gastroenterology

## 2020-05-07 ENCOUNTER — Ambulatory Visit: Payer: BLUE CROSS/BLUE SHIELD | Attending: Gastroenterology

## 2020-05-07 ENCOUNTER — Telehealth: Payer: BLUE CROSS/BLUE SHIELD

## 2020-05-07 DIAGNOSIS — K219 Gastro-esophageal reflux disease without esophagitis: Secondary | ICD-10-CM

## 2020-05-07 DIAGNOSIS — Z1211 Encounter for screening for malignant neoplasm of colon: Secondary | ICD-10-CM

## 2020-05-07 MED ORDER — CLENPIQ 10-3.5-12 MG-GM -GM/160ML PO SOLN
0 refills | Status: AC
Start: 2020-05-07 — End: ?

## 2020-05-07 NOTE — Telephone Encounter
LVM to assist with scheduling EGD/COLON procedures. Please transfer.

## 2020-05-07 NOTE — Progress Notes
Gastroenterology Outpatient Consult   ATTENDING: Lysle Rubens Caree Wolpert   PATIENT: Russell Wiggins  MRN: [id number]  DOB: 24-Jul-1975  DATE OF SERVICE: 05/07/2020    REFERRING PRACTITIONER: Marcelene Butte, MD  PRIMARY CARE PROVIDER: Marcelene Butte, MD    REASON FOR REFERRAL:   Chief Complaint   Patient presents with   ??? New Consult   ??? Colonoscopy        Subjective:     Chief Complaint:  Russell Wiggins is a 45 y.o. male who presents for New Consult and Colonoscopy.    --hx of gastric banding and with heartburn frequently             Imaging:       GI Studies :     Pathology Reports:      Past Medical History:  Past Medical History:   Diagnosis Date   ??? Empyema (HCC/RAF)    ??? Obesity      Patient Active Problem List   Diagnosis   ??? Central serous chorioretinopathy of left eye        Past Surgical History:  Past Surgical History:   Procedure Laterality Date   ??? HIP FRACTURE SURGERY Right    ??? LAPAROSCOPIC GASTRIC BANDING     ??? LUNG SURGERY         Family History: family history includes Diabetes type II in his maternal grandfather; Lymphoma in his father; Obesity in his father and paternal uncle; Osteoporosis in his paternal aunt; Parkinsonism in his paternal grandfather.    Social History:  reports that he has never smoked. He has never used smokeless tobacco. He reports that he does not drink alcohol and does not use drugs.      Allergies :    has No Known Allergies.    Medications:  No current outpatient medications on file.     No current facility-administered medications for this visit.         Physical Exam:    Vitals: BP 116/74  ~ Pulse 96  ~ Temp 36.4 ???C (97.6 ???F) (Forehead)  ~ Ht 5' 6'' (1.676 m)  ~ Wt (!) 298 lb 9.6 oz (135.4 kg)  ~ SpO2 95%  ~ BMI 48.20 kg/m???    Body mass index is 48.2 kg/m???.    System Check if examined and normal Positive or additional negative findings   Constit  [x]  General appearance - normal     Eyes  []  Conjuctivae clear       HENMT  []  Normal Lips/teeth/gums, oropharynx, head     Neck  []  Inspection/palpation; thyroid normal      Resp  []  Effort good. Clear to auscultation bilaterally.     CV  []  Normal Rhythm/rate without murmur   []   Normal pulses     Abdomen  []  Abdomen soft and non-tender   []  Active bowel sounds 4 quadrants   []  No rebound/guarding   []  No appreciable flank or shifting dullness   []  No hepatosplenomegaly   []  No umbilical hernia    Rectal   []  No external hemorrhoids   []  No masses palpated on DRE   []  Brown stool in vault   []   Appropriate resting and squeeze pressure   []   Appropriate bear down maneuver      MSK  []  Grossly normal strength, ROM     Neuro   []  Grossly normal      Psychiatric  []   Oriented to time, place and  person   []   Appropriate insight/judgement & mood/affect               Lab Review:  Lab Results   Component Value Date    WBC 8.09 10/03/2019    HGB 15.8 10/03/2019    HCT 49.3 10/03/2019    MCV 97.2 10/03/2019    PLT 249 10/03/2019     Lab Results   Component Value Date    CREAT 0.94 10/03/2019    BUN 11 10/03/2019    NA 141 10/03/2019    K 4.8 10/03/2019    CL 104 10/03/2019    CO2 27 10/03/2019    ALT 42 10/03/2019    AST 31 10/03/2019    ALKPHOS 57 10/03/2019    BILITOT 0.3 10/03/2019    ALBUMIN 4.3 10/03/2019     No results found for: SRWEST, CRP, GLIADINIGA, GLIADINIGG, ENDOMAB, TRNGLUTIGAAB, IGASER  Lab Results   Component Value Date    INR 1.1 12/06/2015     No results found for: HELPYLAB, HELPYLAGSTL          Visit Diagnoses  / Problems addressed on 05/07/2020:     Encounter Diagnoses   Name Primary?   ??? Colon cancer screening Yes   ??? Gastroesophageal reflux disease without esophagitis         Number and Complexity of Problems Addressed at the Encounter:   [x]   1 or more chronic illness with exacerbation, progression, or side effects of treatment  []   2 or more stable chronic illness  []   1 undiagnosed new problem with uncertain diagnosis  []   1 acute illness with systemic symptoms  []   1 acute complicated injury     Review of Data: I have  [x]  Reviewed/ordered ? 3 unique laboratory, radiology, and/or diagnostic tests noted    [x]  Reviewed prior external notes and incorporated into patient assessment as noted  [x]  I have independently interpreted test performed by other physician(s) as noted   [x]  Discussed management or test interpretation with external provider(s) as noted       Risk of Complication and or Morbidity or Mortality of Patient Management including Social Determinants of Health:   [x]   I deem the above diagnoses to have a risk of complication, morbidity or mortality of Moderate   []  The diagnosis or treatment of said conditions is significantly limited by social determinants of health as noted.     Assessment & Plan :       Impression  1. Chronic GERD s/p gastric banding  2. Colon cancer screening    Plan  1. Schedule endoscopy and colonoscopy            The above recommendation were discussed with the patient. The patient has all questions answered satisfactorily and is in agreement with this recommended plan of care.    Dierdre Forth, MD   05/07/2020 2:27 PM  Assistant Clinical Professor of Medicine   [facility] [employer]  Department of Medicine  Division of Digestive Diseases  [facility]  [address], [address]  [place], [place] [address]    Billing:  30  min were spent on this patient visit included physician face-to-face time, preparing to see the patient, counseling and educating patient/family/caregiver, ordering medications/tests/procedures, referring and communicating with other healthcare professionals, documenting clinical information in the EHR and independently interpreting results and communicating results to patient/family/caregiver.      New patient   [phone] 30-44 min  99204 45-59 min  40347 60-74 min    Established patient:  99213 20-29 min  42595 30-39 min  63875 40-55 min

## 2020-06-05 ENCOUNTER — Inpatient Hospital Stay: Payer: BLUE CROSS/BLUE SHIELD | Attending: Gastroenterology

## 2020-06-05 ENCOUNTER — Telehealth: Payer: BLUE CROSS/BLUE SHIELD

## 2020-06-05 DIAGNOSIS — Z01812 Encounter for preprocedural laboratory examination: Secondary | ICD-10-CM

## 2020-06-05 NOTE — Telephone Encounter
MPU Procedure Checklist:     [x]  COVID Order has been pended and sent to MD performing procedure to sign.     [x] Open access patient- Pharmacy on file has been confirmed and bowel prep medication has been pended.    Prep instructions have been delivered to patient via:  [] Email to __________  [] Fax to ____________  [] Mail to Address on file  [] Sent via letter on Mychart  [x] Preps provided by the office      Encounter sent to FCU team if the following applies:  [] Procedure scheduled on:  [] Changes have been made to current procedure scheduled.       MAC Script    Patient has been advised of method of anesthesia that will be used in this procedure.  If MAC used, MAC script has been advised.  If IVCS used, pt given option of MAC.     [x] Pt has been offered all options and is requesting to be scheduled with MAC. Patient will pay $200 if authorization is denied.    [] Pt has been scheduled in Person on sedation day (if insurance applicable).      [] Patient has requested to be scheduled with conscious sedation in one of our community Medical procedure units.

## 2020-06-07 ENCOUNTER — Ambulatory Visit: Payer: BLUE CROSS/BLUE SHIELD

## 2020-06-26 ENCOUNTER — Ambulatory Visit: Payer: BLUE CROSS/BLUE SHIELD

## 2020-07-11 ENCOUNTER — Ambulatory Visit: Payer: BLUE CROSS/BLUE SHIELD

## 2020-07-11 ENCOUNTER — Telehealth: Payer: BLUE CROSS/BLUE SHIELD

## 2020-07-11 NOTE — Telephone Encounter
Confirmation Documentation   (left msg) procedure(s)   Date: 06/09  Time: 3:00PM  Check-in Time: 2:00PM  NOTE:  [facility]: Check in time is 1.5 hours prior to procedure in [address].   [place] [facility]PU: If patient is scheduled at 7am, Check in time is at 6:15am        Colonoscopy  X   Endoscopy  X   Bravo    Cath Placement    Sigmoidoscopy     Small Bowel Entero.    Esophageal Manometry    Anorectal Manometry    Biofeedback    pH study    Capsule Endoscopy    Secretin Infusion Test    Sham Feeding Study        Informed pt:  1. Of Arrival time (time varies based on location)?  2. Of location and room number?  3. Make sure there is a confirmed ride for the procedure.  4. Has procedure been authorized?  5. Was MAC Script provided?   6. Reviewed Prep instructions with patient? Y/N?  7. Has COVID appt been scheduled?  8. Scheduled procedure and physician's order match? Y/N?  9. Informed patient to call back with any questions and provided number.Y/N?    NOTE: If patients have any questions regarding prescribed medication patient needs to contact their prescribing physician to ensure it is ok to stop medications.      Comments/Notes:        If VM is left and pt calls back, please warm transfer patient to [phone]. This number is for internal use only and is not to be provided to patients.

## 2020-09-02 ENCOUNTER — Ambulatory Visit: Payer: BLUE CROSS/BLUE SHIELD

## 2020-09-02 DIAGNOSIS — K9509 Other complications of gastric band procedure: Secondary | ICD-10-CM

## 2020-09-02 NOTE — Progress Notes
PATIENT: Russell Wiggins  DATE OF SERVICE: 09/02/2020  MRN: [id number]       REFERRING PRACTITIONER:   PRIMARY CARE PROVIDER: Barton Dubois, MD, Internal Medicine       CHIEF COMPLAINT:   Chief Complaint   Patient presents with   ? issues with gastric lap band surgery       Subjective:      Russell Wiggins is a 45 y.o. here for follow up.      History of present illness:     In 2009, had gastric bypass surgery  Then in 2011, had aspiration event leading to emypema.   During that time, lap band was ''relaxed'' but not removed to prevent further complication during aspiration   Now wants to f/u on gastric band     Past Medical History:   Diagnosis Date   ? Empyema (HCC/RAF)    ? Obesity      Past Surgical History:   Procedure Laterality Date   ? HIP FRACTURE SURGERY Right    ? LAPAROSCOPIC GASTRIC BANDING     ? LUNG SURGERY       Family History   Problem Relation Age of Onset   ? Lymphoma Father    ? Obesity Father    ? Osteoporosis Paternal Aunt    ? Obesity Paternal Uncle    ? Diabetes type II Maternal Grandfather    ? Parkinsonism Paternal Grandfather      Social History     Socioeconomic History   ? Marital status: Married   Tobacco Use   ? Smoking status: Never Smoker   ? Smokeless tobacco: Never Used   Substance and Sexual Activity   ? Alcohol use: No     Alcohol/week: 0.0 oz   ? Drug use: No   ? Sexual activity: Yes     Partners: Female   Other Topics Concern   ? Do you exercise at least a day, 3 or more days a week? No         Current Outpatient Medications:   ?  Sod Picosulfate-Mag Ox-Cit Acd (CLENPIQ) 10-3.5-12 MG-GM -GM/160ML SOLN, Drink 1st bottle between 3-6pm the night prior to your colonoscopy. Then drink x2 16 oz glasses of water. Drink the 2nd bottle 6-8hrs before your procedure and an additional x2 16 oz glasses of water.... (Patient not taking: Reported on 09/02/2020.), Disp: 160 mL, Rfl: 0      Review of Systems: (2)   A 14-system review of systems was performed and is negative except as stated in the history of present illness.     Objective:        Vitals:    Last Recorded Vital Signs:    09/02/20 1106   BP: (P) 125/79   Pulse: (P) 99   Resp: 18   Temp: 36.9 ?C (98.4 ?F)        General:   alert, appears stated age and cooperative   Head:   Normocephalic, without obvious abnormality, atraumatic   Cardiovascular: regular rate and rhythm, S1, S2 normal, no murmur, click, rub or gallop   Pulmonary:   clear to auscultation bilaterally   Abdomen:   soft, non-tender; bowel sounds normal     Lab Review:   Reviewed        Assessment & Plan:        Russell Wiggins is 45 y.o. here for follow up     1. Complication of gastric banding  2. Gastric band  erosion  - [facility]erral to [facility]; surgical program)      Health Maintenance   Topic Date Due   ? Hepatitis B Screening  Never done   ? Colorectal Cancer Screening  Never done   ? Influenza Vaccine (1) 10/10/2020   ? Tdap/Td Vaccine (2 - Td or Tdap) 08/30/2023   ? Hepatitis C Screening  Completed   ? COVID-19 Vaccine(Tracks primary and booster doses, not sup/immunocomp)  Completed   ? HIV Screening  Completed         Return to clinic: prn        The above recommendation were discussed with the patient including the risk and benefits of each medication and the potential side effects of each medication. The patient has all questions answered satisfactorily and is in agreement with this recommended plan of care.      Barton Dubois, MD  09/02/2020

## 2020-09-10 ENCOUNTER — Non-Acute Institutional Stay: Payer: BLUE CROSS/BLUE SHIELD | Attending: Gastroenterology

## 2020-09-19 ENCOUNTER — Ambulatory Visit: Payer: BLUE CROSS/BLUE SHIELD

## 2020-09-19 ENCOUNTER — Telehealth: Payer: BLUE CROSS/BLUE SHIELD

## 2020-09-19 NOTE — Telephone Encounter
Confirmation Documentation   (left msg) procedure(s)   Date:09/26/20  Time:0100  Check-in Time:1200  NOTE:  [facility]: Check in time is 1.5 hours prior to procedure in [address].   [place] [facility]: If patient is scheduled at 7am, Check in time is at 6:15am        Colonoscopy  x   Endoscopy  x   Bravo    Cath Placement    Sigmoidoscopy     Small Bowel Entero.    Esophageal Manometry    Anorectal Manometry    Biofeedback    pH study    Capsule Endoscopy    Secretin Infusion Test    Sham Feeding Study        Informed pt:  1. Of Arrival time (time varies based on location)?  2. Of location and room number?  3. Make sure there is a confirmed ride for the procedure.  4. Has procedure been authorized?  5. Was MAC Script provided?   6. Reviewed Prep instructions with patient? Y/N?  7. Has COVID appt been scheduled?  8. Scheduled procedure and physician's order match? Y/N?  9. Informed patient to call back with any questions and provided number.Y/N?    NOTE: If patients have any questions regarding prescribed medication patient needs to contact their prescribing physician to ensure it is ok to stop medications.      Comments/Notes:        If VM is left and pt calls back, please warm transfer patient to [phone]. This number is for internal use only and is not to be provided to patients.

## 2020-09-21 NOTE — Telephone Encounter
Confirmation Documentation    (confirmed) procedure(s)   Date:  09/26/20  Time:1pm   Check-in Time:12pm   NOTE:  [facility]: Check in time is 1.5 hours prior to procedure in [address].   [place] M[facility]: If patient is scheduled at 7am, Check in time is at 6:15am        Colonoscopy  X   Endoscopy  X   Bravo    Cath Placement    Sigmoidoscopy     Small Bowel Entero.    Esophageal Manometry    Anorectal Manometry    Biofeedback    pH study    Capsule Endoscopy    Secretin Infusion Test    Sham Feeding Study        Informed pt:  1. Of Arrival time (time varies based on location)? Y  2. Of location and room number?Y  3. Make sure there is a confirmed ride for the procedure.Y  4. Has procedure been authorized?Y  5. Was MAC Script provided? Y  6. Reviewed Prep instructions with patient? Y/N?Y  7. Has COVID appt been scheduled?Y  8. Scheduled procedure and physician's order match? Y/N?Y  9. Informed patient to call back with any questions and provided number.Y/N?Y    NOTE: If patients have any questions regarding prescribed medication patient needs to contact their prescribing physician to ensure it is ok to stop medications.      Comments/Notes:        If VM is left and pt calls back, please warm transfer patient to [phone]. This number is for internal use only and is not to be provided to patients.

## 2020-09-24 ENCOUNTER — Institutional Professional Consult (permissible substitution): Payer: BLUE CROSS/BLUE SHIELD

## 2020-09-24 DIAGNOSIS — Z01812 Encounter for preprocedural laboratory examination: Secondary | ICD-10-CM

## 2020-09-25 LAB — COVID-19 PCR/TMA: COVID-19 PCR/TMA: NOT DETECTED

## 2020-09-26 MED ADMIN — SODIUM CHLORIDE 0.9 % IV SOLN: 500 mL | INTRAVENOUS | @ 20:00:00 | Stop: 2020-09-27 | NDC 00338954304

## 2020-09-26 MED ADMIN — GLYCOPYRROLATE 1 MG/5ML IJ SOLN: INTRAVENOUS | @ 20:00:00 | Stop: 2020-09-26 | NDC 70700016725

## 2020-09-26 MED ADMIN — PROPOFOL 200 MG/20ML IV EMUL: INTRAVENOUS | @ 20:00:00 | Stop: 2020-09-26 | NDC 63323026929

## 2020-09-26 MED ADMIN — KETAMINE HCL 10 MG/ML IJ SOLN: INTRAVENOUS | @ 20:00:00 | Stop: 2020-09-26 | NDC 42023011310

## 2020-09-26 MED ADMIN — LIDOCAINE HCL (CARDIAC) 100 MG/5ML IV SOSY: INTRAVENOUS | @ 20:00:00 | Stop: 2020-09-26 | NDC 76329339001

## 2020-09-26 NOTE — Procedures
PATIENT NAME:       Russell Wiggins, Russell Wiggins  DATE OF BIRTH:       February 01, 1976  RECORD NUMBER:      [id number]  DATE/TIME OF PROCEDURE:     09/26/2020 / 01:00:00 PM  ENDOSCOPIST:       Clydene Laming,  REFERRING PHYSICIAN:        FELLOW:           INDICATIONS FOR EXAMINATION:      Screening Colonoscopy.               PROCEDURE PERFORMED:             COLONOSCOPY - screening, average risk    MEDICATIONS:    See anesthesia report    PROCEDURE TECHNIQUE:  Patient's medications, allergies, past medical, surgical, social and family histories were reviewed and updated as appropriate. A discussion of informed consent was had with the patient and/or the patient's family prior to the procedure, including   sedation.  The alternatives, benefits and risks of  the procedure including but not limited to perforation, hemorrhage, infection, adverse drug reaction and aspiration were discussed    Description of Procedure:  After discussion of informed consent, and appropriate level of sedation were attained, the patient was placed in the left lateral position.  The patient was monitored continuously with pulse oximetry, blood pressure monitoring, and direct observations.      After digital rectal examination, the colonoscope CF-HQ190L #[id number] was inserted into the rectum and advanced under direct vision to the level of the cecum.  The quality of the colonic preparation was Excellent.  A careful inspection was made as the   colonoscope was withdrawn.  Findings and interventions are described below.    Total Withdrawl Time: 00:07:13  Total Insertion Time:                              EXTENT OF EXAM:  cecum            INSTRUMENTS:   [id number] #[id number]  TECHNICAL DIFFICULTY:  No   LIMITATIONS:        TOLERANCE:   Good  VISUALIZATION:  Good    FINDINGS:   1. Normal colonoscopy    ESTIMATED BLOOD LOSS:   None     DIAGNOSIS:  1. Normal colonoscopy    RECOMMENDATIONS:  1. Resume colon cancer screening in 10 years            This electronic signature authenticates all electronic and/or handwritten documentation, including orders, generated by the signer during the episode of care contained in this record.  09/26/2020 01:40:49 PM By Clydene Laming

## 2020-09-26 NOTE — H&P
Indication for Procedure: colon cancer screening     Level of sedation intended for procedure: moderate, deep, MAC  []  Moderate []  Deep [x]  MAC []  Anesthesia   Prior sedation problems []  Yes [x]  No  If yes explain:  Teeth: [x]  Intact []  Loose []  Missing []  Dentures []  Bridges  [place] Visualized: [x]  Yes []  Partially []  not at all  Neck ROM: [x]   Normal []  Limited  Past Medical History:  Past Medical History:   Diagnosis Date   ? Empyema (HCC/RAF)    ? Obesity        Past Surgical History:  Past Surgical History:   Procedure Laterality Date   ? HIP FRACTURE SURGERY Right    ? LAPAROSCOPIC GASTRIC BANDING     ? LUNG SURGERY          Family History:  Family History   Problem Relation Age of Onset   ? Lymphoma Father    ? Obesity Father    ? Osteoporosis Paternal Aunt    ? Obesity Paternal Uncle    ? Diabetes type II Maternal Grandfather    ? Parkinsonism Paternal Grandfather         Social History:  Social History     Socioeconomic History   ? Marital status: Married   Tobacco Use   ? Smoking status: Never Smoker   ? Smokeless tobacco: Never Used   Substance and Sexual Activity   ? Alcohol use: No     Alcohol/week: 0.0 oz   ? Drug use: No   ? Sexual activity: Yes     Partners: Female   Other Topics Concern   ? Do you exercise at least a day, 3 or more days a week? No       Allergies:  No Known Allergies    Current Medications:  No current facility-administered medications for this encounter.     ROS:  Constitutional: The patient denies any fever or chills.  HEENT: no symptoms  Skin: Patient denies bruising, rashes or itching.  Cardiovascular: Patient denies any chest pains.  There is no palpitations.  Respiratory: Patient denies any shortness of breath.  There is no cough or congestion.    Gastrointestinal: as in HPI and chief complaints  Genitourinary: Patient denies any symptoms of frequency urination or burning urination.  Neurological: Patient denies any lightheadedness, dizziness or loss of consciousness.  Psychiatric: Patient denies any symptoms of anxiety, depression or agitation.  Musculoskeletal: Patient denies any ankle swelling.      Physical Exam:    Vitals Signs: There were no vitals taken for this visit.  Constitutional: Patient appears very comfortable.  HENT: Normal exam  HEENT: normal exam  Cardiovascular: Heart sounds are normal.  There no murmurs or gallops.  There is no irregular heartbeat.  Respiratory: Patient is not short of breath.  There is no wheezing or rales.  Lungs are clear to auscultation.  Gastrointestinal: Abdomen is soft and nontender.  Bowel sounds are present.   No masses noted.   Genitourinary: Unremarkable examination.  Musculoskeletal: There is no edema, cyanosis or clubbing.    Integumentry: There is no bruising.  No rashes are noted.  Neurological: Patient is awake alert and fully oriented. Psychiatric: Patient is not depressed, apprehensive or agitated.      Assessment:  Russell Wiggins is a 45 y.o.male who is brought to the endoscopy suite for the procedure because of the following indications: colon cancer screening  The procedure and its possible complications including but not limited to bleeding and perforation and their treatment including but not limited to blood transfusion and even surgery was once again explained to, and understood by the patient. Patient agrees to proceed with the procedure.    ASA Classification:  1    I have reviewed the history and physical and have determined [name] to be an appropriate candidate and medically stable to undergo the procedure with planned sedation and analgesia.    Patient cleared for discharge once discharge criteria is met.

## 2020-09-26 NOTE — Discharge Instructions
PROCEDURE:       [ ] Upper Endoscopy (EGD)      [ x] Colonoscopy      [ ] Sigmoidoscopy    FINDINGS:  see procedure note     ACTIVITY:               [X] Avoid any strenuous physical activity for 24 hours               [X] No driving or operating heavy machinery for 24 hours               [X] No alcoholic beverages for 24 hours (may interact with some medications you received during your procedure)    DIET:               [X] Resume your usual diet               [ ] Specific diet instructions:     Call Dr. ?s office if you experience any of the following:               [X] Severe chest pain, difficulty breathing               [X] Passage of blood clots, black tarry stools, vomiting blood               [X] Severe inflammation at the I.V. site               [X] Difficulty swallowing or persistent vomiting               [X] Abdominal pain               [X] Chills or fever greater than 101 F or 38.4 C within 24 hours of procedure    FOLLOW-UP CARE:                [X] Dr Shealyn Sean will message you on the [facility] app if you sign up  for it or will give you a call within a week. If you do not hear back within a week,  please call the  [place] [facility] at [phone]       []Dr Lark Langenfeld's office will call you to schedule an appointment for follow up  of biopsy results                  [] No Aspirin or NSAIDs for the next 10 days.                  [] Repeat colonoscopy interval depending on biopsy results

## 2020-09-26 NOTE — H&P
UPDATED H&P REQUIREMENT    For [facility] and [facility]    WHAT IS THE STATUS OF THE PATIENT'S MOST CURRENT HISTORY AND PHYSICAL?   - The most current H&P is >24 hours and <30 days, and having examined the patient, I attest that there have been no changes. (This suffices as an update to the H&P).      REFER TO MEDICAL STAFF POLICIES REGARDING PRE-PROCEDURE HISTORY AND PHYSICAL EXAMINATION AND UPDATED H&P REQUIREMENTS BELOW:    [facility] and [facility] Medical Staff Policy 200 - For Patients Undergoing Procedures Requiring Moderate or Deep Sedation, General Anesthesia or Regional Anesthesia    Contents of a History and Physical Examination (H&P):    The H&P shall consist of chief complaint, history of present illness, allergies and medications, relevant social and family history, past medical history, review of systems and physical examination, and assessment and plan appropriate to the patient's age.    For Patients Undergoing Procedures Requiring Moderate or Deep Sedation, General Anesthesia or Regional Anesthesia:    1. An H&P shall be performed within 24 hours prior to the procedure by a qualified member of the medical staff or designee with appropriate privileges, except as noted in item 2 below.    2. If a complete history and physical was performed within thirty (30) calendar days prior to the patient's admission to the Medical Center for elective surgery, a member of the medical staff assumes the responsibility for the accuracy of the clinical information and will need to document in the medical record within twenty-four (24) hours of admission and prior to surgery or major invasive procedure, that they either attest that the history and physical has been reviewed and accepted, or document an update of the original history and physical relevant to the patient's current  clinical status.    3. Providing an H&P for patients undergoing surgery under local anesthesia is at the discretion of the Attending Physician.     4. When a procedure is performed by a dentist, podiatrist or other practitioner who is not privileged to perform an H&P, the anesthesiologist's assessment immediately prior to the procedure will constitute the 24 hour re-assessment.The dentist, podiatrist or other practitioner who is not privileged to perform an H&P will document the history and physical relevant to the procedure.    5. If the H&P and the written informed consent for the surgery or procedure are not recorded in the patient's medical record prior to surgery, the operation shall not be performed unless the attending physician states in writing that such a delay could lead to an adverse event or irreversible damage to the patient.    6. The above requirements shall not preclude the rendering of emergency medical or surgical care to a patient in dire circumstances.

## 2020-09-26 NOTE — Procedures
PATIENT NAME:       Russell Wiggins, Russell Wiggins  DATE OF BIRTH:       17-Sep-1975  RECORD NUMBER:      [id number]  DATE/TIME OF PROCEDURE:     09/26/2020 / 01:00:00 PM  ENDOSCOPIST:       Clydene Laming,  REFERRING PHYSICIAN:        FELLOW:           INDICATIONS FOR EXAMINATION:      chronic GERD with hx of gastric banding               PROCEDURE PERFORMED:             UPPER GI ENDOSCOPY - biopsy    MEDICATIONS:    See anesthesia report    PROCEDURE TECHNIQUE:  Patient's medications, allergies, past medical, surgical, social and family histories were reviewed and updated as appropriate. A discussion of informed consent was had with the patient and/or the patient's family prior to the procedure, including   sedation.  The alternatives, benefits and risks of  the procedure including but not limited to perforation, hemorrhage, infection, adverse drug reaction and aspiration were discussed    Procedure Details:     Informed consent was obtained for the procedure, including sedation.  Risks of perforation, hemorrhage, infection, adverse drug reaction and aspiration were discussed. The patient was placed in position.  Based on the pre-procedure assessment, including   review of the patient's medical history, medications, allergies, and review of systems, the patient had been deemed to be an appropriate candidate for conscious sedation; the patient was therefore sedated with the medications listed.  The patient was   monitored continuously with pulse oximetry, blood pressure monitoring, and direct observations.      The GIF H180J [id number] was introduced and passed without difficulty to second part of duodenum. A careful inspection was made as the endoscope was withdrawn.    Findings and interventions are described below:    Total Withdrawl Time:   Total Insertion Time:                              EXTENT OF EXAM:  second part of duodenum            INSTRUMENTS:   GIF H180J [id number]  TECHNICAL DIFFICULTY:  No   LIMITATIONS: TOLERANCE:   Good  VISUALIZATION:  Good    FINDINGS:   1. Esophagus: lap band in place with what appears to be 4 cm hiatal hernia with grade B esophagitis ; biopsies were taken  2. stomach: there was the appearance of a pancreatic rest adjacent to the pylorus s/p biopsies otherwise no ulcers or masses present   3. duodenum: normal in appearance in the bulb and 2nd portion without ulcers or masses present    ESTIMATED BLOOD LOSS:   None     DIAGNOSIS:  1. lap band in place with what appears to be 4 cm hiatal hernia with grade B esophagitis ; biopsies were taken  2.  appearance of a pancreatic rest adjacent to the pylorus s/p biopsies    RECOMMENDATIONS:  1. Proceed to colonoscopy  2. recommend following up with surgery regarding lap band            This electronic signature authenticates all electronic and/or handwritten documentation, including orders, generated by the signer during the episode of care contained in this record.  09/26/2020 01:39:06 PM By Clydene Laming

## 2020-09-26 NOTE — Op Note
Pt is changing

## 2020-09-30 LAB — Tissue Exam

## 2020-10-02 ENCOUNTER — Ambulatory Visit: Payer: BLUE CROSS/BLUE SHIELD

## 2020-10-10 ENCOUNTER — Ambulatory Visit: Payer: BLUE CROSS/BLUE SHIELD

## 2020-10-10 ENCOUNTER — Emergency Department (HOSPITAL_BASED_OUTPATIENT_CLINIC_OR_DEPARTMENT_OTHER): Payer: No Typology Code available for payment source

## 2020-10-10 ENCOUNTER — Emergency Department (HOSPITAL_BASED_OUTPATIENT_CLINIC_OR_DEPARTMENT_OTHER)
Admission: EM | Admit: 2020-10-10 | Discharge: 2020-10-10 | Disposition: A | Discharge status: Home / Self Care | Payer: No Typology Code available for payment source | Attending: Emergency Medicine | Admitting: Emergency Medicine

## 2020-10-10 ENCOUNTER — Other Ambulatory Visit: Payer: Self-pay

## 2020-10-10 DIAGNOSIS — F17210 Nicotine dependence, cigarettes, uncomplicated: Secondary | ICD-10-CM | POA: Insufficient documentation

## 2020-10-10 DIAGNOSIS — M549 Dorsalgia, unspecified: Secondary | ICD-10-CM | POA: Insufficient documentation

## 2020-10-10 DIAGNOSIS — R109 Unspecified abdominal pain: Secondary | ICD-10-CM

## 2020-10-10 DIAGNOSIS — R1032 Left lower quadrant pain: Secondary | ICD-10-CM | POA: Insufficient documentation

## 2020-10-10 LAB — URINALYSIS, ROUTINE W REFLEX MICROSCOPIC
Bilirubin Urine: NEGATIVE
Glucose, UA: NEGATIVE mg/dL
Hgb urine dipstick: NEGATIVE
Ketones, ur: NEGATIVE mg/dL
Leukocytes,Ua: NEGATIVE
Nitrite: NEGATIVE
Protein, ur: NEGATIVE mg/dL
Specific Gravity, Urine: 1.025 (ref 1.005–1.030)
pH: 6 (ref 5.0–8.0)

## 2020-10-10 LAB — COMPREHENSIVE METABOLIC PANEL
ALT: 22 U/L (ref 0–44)
AST: 14 U/L — ABNORMAL LOW (ref 15–41)
Albumin: 4.1 g/dL (ref 3.5–5.0)
Alkaline Phosphatase: 106 U/L (ref 38–126)
Anion gap: 7 (ref 5–15)
BUN: 17 mg/dL (ref 6–20)
CO2: 25 mmol/L (ref 22–32)
Calcium: 9 mg/dL (ref 8.9–10.3)
Chloride: 107 mmol/L (ref 98–111)
Creatinine, Ser: 1.29 mg/dL — ABNORMAL HIGH (ref 0.61–1.24)
GFR, Estimated: >60 mL/min (ref >60)
Glucose, Bld: 99 mg/dL (ref 70–99)
Potassium: 4.2 mmol/L (ref 3.5–5.1)
Sodium: 139 mmol/L (ref 135–145)
Total Bilirubin: 0.3 mg/dL (ref 0.3–1.2)
Total Protein: 7.1 g/dL (ref 6.5–8.1)

## 2020-10-10 LAB — CBC
HCT: 46.4 % (ref 39.0–52.0)
Hemoglobin: 15.5 g/dL (ref 13.0–17.0)
MCH: 31 pg (ref 26.0–34.0)
MCHC: 33.4 g/dL (ref 30.0–36.0)
MCV: 92.8 fL (ref 80.0–100.0)
Platelets: 278 10*3/uL (ref 150–400)
RBC: 5 MIL/uL (ref 4.22–5.81)
RDW: 12.8 % (ref 11.5–15.5)
WBC: 8.8 10*3/uL (ref 4.0–10.5)
nRBC: 0 % (ref 0.0–0.2)

## 2020-10-10 LAB — LIPASE, BLOOD: Lipase: 87 U/L — ABNORMAL HIGH (ref 11–51)

## 2020-10-10 MED ORDER — IOHEXOL 350 MG/ML SOLN
80.0000 mL | Freq: Once | INTRAVENOUS | Status: AC | PRN
  Administered 2020-10-10: 75 mL via INTRAVENOUS

## 2020-10-10 NOTE — Progress Notes
BARIATRIC NEW PATIENT EVAL    REASON FOR CONSULTATION: Lap band complications     HISTORY OF PRESENT ILLNESS: The patient is a 45 y.o. male who presents today to begin the process of comprehensive evaluation for lap band complications. He had a lap band placed in 2007. His pre-op weight was 298 pounds and his lowest weight after surgery was 238 pounds. He was compliant with all the dietary instructions. He developed aspiration in 2011 and the band was deflated. Then he gained all the lost weight back. Recently he has pain and discomfort after eating. He also has severe GERD. The EGD showed a 4 cm hiatal hernia.     In clinic today measurements were Ht:5' 5.5'' (1.664 m) Wt:(!) 300 lb (136.1 kg) GNF:AOZH surface area is 2.51 meters squared.Marland Kitchen BMI is Body mass index is 49.16 kg/m?Marland Kitchen. Ideal body weight is Weight in (lb) to have BMI = 25: 152.2.  The excess body weight interferes with activities of daily living and negatively impacts quality of life.         MEDICAL CONDITIONS:  Patient Active Problem List   Diagnosis   ? Central serous chorioretinopathy of left eye        Past Medical History:   Diagnosis Date   ? Empyema (HCC/RAF)    ? Obesity        MEDICATIONS:    Current Outpatient Medications:   ?  Sod Picosulfate-Mag Ox-Cit Acd (CLENPIQ) 10-3.5-12 MG-GM -GM/160ML SOLN, Drink 1st bottle between 3-6pm the night prior to your colonoscopy. Then drink x2 16 oz glasses of water. Drink the 2nd bottle 6-8hrs before your procedure and an additional x2 16 oz glasses of water...., Disp: 160 mL, Rfl: 0    PAST MEDICAL HISTORY:   Past Medical History:   Diagnosis Date   ? Empyema (HCC/RAF)    ? Obesity        PAST SURGICAL HISTORY:   Past Surgical History:   Procedure Laterality Date   ? HIP FRACTURE SURGERY Right    ? LAPAROSCOPIC GASTRIC BANDING     ? LUNG SURGERY         FAMILY HISTORY:   Family History   Problem Relation Age of Onset   ? Lymphoma Father    ? Obesity Father    ? Osteoporosis Paternal Aunt    ? Obesity Paternal Uncle    ? Diabetes type II Maternal Grandfather    ? Parkinsonism Paternal Grandfather        SOCIAL HISTORY:   Social History     Socioeconomic History   ? Marital status: Married   Tobacco Use   ? Smoking status: Never Smoker   ? Smokeless tobacco: Never Used   Substance and Sexual Activity   ? Alcohol use: No     Alcohol/week: 0.0 oz   ? Drug use: No   ? Sexual activity: Yes     Partners: Female   Other Topics Concern   ? Do you exercise at least a day, 3 or more days a week? No         REVIEW OF SYSTEMS:  A comprehensive 14-point review of systems were reviewed, pertinent positives and negatives listed in the HPI, all other systems were reviewed and are negative.     PHYSICAL EXAMINATION:     Ht:5' 5.5'' (1.664 m) Wt:(!) 300 lb (136.1 kg) YQM:VHQI surface area is 2.51 meters squared.    BP 121/81  ~ Pulse 85  ~ Temp 36.1 ?  C (97 ?F) (Tympanic)  ~ Ht 5' 5.5'' (1.664 m)  ~ Wt (!) 300 lb (136.1 kg)  ~ SpO2 98%  ~ BMI 49.16 kg/m?     GENERAL: Morbidly obese individual in no acute distress.  NEURO: Alert and oriented x3, mood and affect appropriate.  HEENT: EOMs grossly normal.   NECK: trachea was in midline.  SKIN: Smooth and dry.  ABDOMEN: Central obesity.  MUSCULOSKELETAL: Grossly normal range of motion.  EXTREMITIES: No gross edema  LUNGS: Clear to auscultation.  CARDIO: Regular rate and rhythm.      ASSESSMENT AND PLAN:    Russell Wiggins is a pleasant 45 y.o. year old male who presents to clinic today for evaluation for possible lap band removal.  He has pain, discomfort, and GERD from the band, I think it is reasonable to remove the band. The risks, benefits and alternatives explained to the patient. Consent obtained.   Once the band is removed, he will think about sleeve gastrectomy option.   PCP pre-op.     We will have the patient for follow up if interested in surgical management. In the interim, we discussed low calorie diet and exercise to help to lose the weight. Patient was encouraged to call the office with any questions or concerns in the meantime. Patient demonstrates understanding without barriers to education, and was agreeable with the plan of care.      Thank you for allowing me to participate in the care of this patient. Should you have any questions, please do not hesitate to contact our team.      Camie Patience MD   Associate Professor  Section of Minimally Invasive and Bariatric Surgery  Division of General Surgery  [facility]  Pager [phone]

## 2020-10-10 NOTE — Progress Notes
BARIATRIC SURGERY OUTPATIENT CONSULTATION     DATE OF SERVICE:  10/10/2020     REASON FOR CONSULTATION:   Lap band removal     HISTORY OF PRESENT ILLNESS:  Russell Wiggins is a very pleasant 45 y.o. male, with history of lap band placement in *** at ***. Patient's preoperative weight was *** pounds, and nadiered at *** pounds.    Patient reports experiencing ***severe heartburn *** since lap band placement, as well as *** generalized abdominal discomfort, nausea and vomiting.     Patient had ***lap band adjustments.     Patient's lap band is currently {Blank single:20442::''Inflated'',''Deflated''}    MEDICATIONS:    Current Outpatient Medications:   ?  Sod Picosulfate-Mag Ox-Cit Acd (CLENPIQ) 10-3.5-12 MG-GM -GM/160ML SOLN, Drink 1st bottle between 3-6pm the night prior to your colonoscopy. Then drink x2 16 oz glasses of water. Drink the 2nd bottle 6-8hrs before your procedure and an additional x2 16 oz glasses of water...., Disp: 160 mL, Rfl: 0       PAST MEDICAL HISTORY:   Past Medical History:   Diagnosis Date   ? Empyema (HCC/RAF)    ? Obesity        PAST SURGICAL HISTORY:   Past Surgical History:   Procedure Laterality Date   ? HIP FRACTURE SURGERY Right    ? LAPAROSCOPIC GASTRIC BANDING     ? LUNG SURGERY         FAMILY HISTORY:   Family History   Problem Relation Age of Onset   ? Lymphoma Father    ? Obesity Father    ? Osteoporosis Paternal Aunt    ? Obesity Paternal Uncle    ? Diabetes type II Maternal Grandfather    ? Parkinsonism Paternal Grandfather        SOCIAL HISTORY:   Social History     Socioeconomic History   ? Marital status: Married   Tobacco Use   ? Smoking status: Never Smoker   ? Smokeless tobacco: Never Used   Substance and Sexual Activity   ? Alcohol use: No     Alcohol/week: 0.0 oz   ? Drug use: No   ? Sexual activity: Yes     Partners: Female   Other Topics Concern   ? Do you exercise at least a day, 3 or more days a week? No       REVIEW OF SYSTEMS:  A comprehensive 14-point review of systems were reviewed, pertinent positives and negatives listed in the HPI, all other systems were reviewed and are negative.     PHYSICAL EXAMINATION:     Ht:5' 5.5'' (1.664 m) Wt:(Abnormal) 300 lb (136.1 kg) GNF:AOZH surface area is 2.51 meters squared.    Blood Pressure 121/81  ~ Pulse 85  ~ Temperature 36.1 ?C (97 ?F) (Tympanic)  ~ Height 5' 5.5'' (1.664 m)  ~ Weight (Abnormal) 300 lb (136.1 kg)  ~ Oxygen Saturation 98%  ~ Body Mass Index 49.16 kg/m?     GENERAL: Morbidly obese individual in no acute distress.  NEURO: Alert and oriented x3, mood and affect appropriate.  HEENT: Pupils equal. EOMs grossly normal.   NECK: Supple, thyroid was not enlarged, trachea was in midline.  LYMPHATICS: No palpable supraclavicular, submandibular, or cervical lymphadenopathy.  LUNGS: Clear to auscultation.  CARDIO: Regular rate and rhythm.  SKIN: Smooth and dry.  ABDOMEN: Central obesity. Bowel sounds normal. Soft, non-tender, no masses. No hepatomegaly, no splenomegaly.  MUSCULOSKELETAL: Grossly normal range of motion.  EXTREMITIES: Palpable pedal pulses. No venous stasis changes.    RADIOLOGICAL IMAGING:  ***    ASSESSMENT AND PLAN:    Russell Wiggins is a pleasant 45 y.o. year old male who presents to our clinic for evaluation for lap band removal in the setting of ***. We discussed with the patient that the symptoms are most likely due to lap band. We discussed the options of deflating the lap band or removing the lap band. We discussed the potential risks and complications of each procedure as well as potential weight regain. After extensive discussion, patient wishes to proceed with Removal of Adjustable Gastric Band with intraoperative EGD. After addressing all questions and concerns, the consent has been signed.      For lap band removal he will need the following items:  1. Blood work ***  2. EKG ***       Patient is also interested in revision surgery for morbid obesity. Therefore, we discussed the procedure ***Laparoscopic sleeve gastrectomy in the event he experiences weight regain. The patient's current Body mass index is 49.16 kg/m?., with co-morbidities diabetes and hypertension, making the patient candidate. We discussed that for conversion to a bariatric surgery we will need to wait 3 months after lap band removal, and will need the following items:      4. Routine labs to include CMP, CBC, TSH, Thiamine, Vitamin D levels, as well as A1c and Lipid panel - blood work completed recently. Thiamine and vitamin D ordered today  5. Nutrition consult  6. Psychology consult  7.  preoperative education class      Patient verbalized understanding and agreeable with the plan of care. Patient was encouraged to call the office with any questions or concerns in the meantime. Patient demonstrates understanding without barriers to education, and was agreeable with the plan of care.      Thank you for allowing me to participate in the care of this patient. Should you have any questions, please do not hesitate to contact our team.     Seu Gaynell Face, NP     Camie Patience MD   Associate Professor  Section of Minimally Invasive and Bariatric Surgery  Division of General Surgery  [facility]  Pager [phone]

## 2020-10-10 NOTE — ED Triage Notes (Addendum)
Left sided abd pain x 2 days pain rads to back he states  denies n/V?/D /dysuria, pooped yesterday and it got a little better

## 2020-10-10 NOTE — ED Notes (Signed)
Patient states onset yesterday of left lower quadrant abdominal pain.  Denies nausea or vomiting.  Last BM yesterday and states there was blood in stool

## 2020-10-10 NOTE — ED Provider Notes (Addendum)
[facility] EMERGENCY DEPT Provider Note   CSN: [id number] Arrival date & time: 10/10/20  1143     History Chief Complaint  Patient presents with   Abdominal Pain    Eddie Rodriguez is a 45 y.o. male.  Patient with a complaint of left lower quadrant abdominal pain for 4 to 5 days.  Was worse for the past 2 days.  Radiates to the back.  Denies any nausea vomiting or diarrhea.  The pain is waxes and wanes.  Sometimes it is completely gone.  No dysuria.  Bowel movements have been normal.  Never had pain like this before.  Past medical history sniffing for posttraumatic stress disorder.      Past Medical History:  Diagnosis Date   PTSD (post-traumatic stress disorder)     There are no problems to display for this patient.   Past Surgical History:  Procedure Laterality Date   EYE SURGERY     HYDROCELE EXCISION / REPAIR         No family history on file.  Social History   Tobacco Use   Smoking status: Every Day    Packs/day: 0.50    Types: Cigarettes   Smokeless tobacco: Never  Substance Use Topics   Alcohol use: No   Drug use: Yes    Types: Marijuana    Home Medications Prior to Admission medications   Medication Sig Start Date End Date Taking? Authorizing Provider  diphenhydrAMINE (BENADRYL) 25 mg capsule Take 25 mg by mouth at bedtime as needed.   Yes [provider]  MELATONIN GUMMIES PO Take 1 tablet by mouth at bedtime.   Yes [provider]  amoxicillin-clavulanate (AUGMENTIN) 875-125 MG tablet Take 1 tablet by mouth every 12 (twelve) hours. Patient not taking: Reported on 10/10/2020 12/26/14   Lyndal Pulley, MD  HYDROcodone-acetaminophen (NORCO/VICODIN) 5-325 MG tablet Take 1 tablet by mouth every 4 (four) hours as needed. Patient not taking: Reported on 10/10/2020 12/26/14   Lyndal Pulley, MD    Allergies    Patient has no known allergies.  Review of Systems   Review of Systems  Constitutional:  Negative for chills  and fever.  HENT:  Negative for rhinorrhea and sore throat.   Eyes:  Negative for visual disturbance.  Respiratory:  Negative for cough and shortness of breath.   Cardiovascular:  Negative for chest pain and leg swelling.  Gastrointestinal:  Positive for abdominal pain. Negative for diarrhea, nausea and vomiting.  Genitourinary:  Negative for dysuria.  Musculoskeletal:  Negative for back pain and neck pain.  Skin:  Negative for rash.  Neurological:  Negative for dizziness, light-headedness and headaches.  Hematological:  Does not bruise/bleed easily.  Psychiatric/Behavioral:  Negative for confusion.    Physical Exam Updated Vital Signs BP 123/89   Pulse 76   Temp 98 F (36.7 C)   Resp 20   Ht 1.803 m (5\' 11" )   Wt 83.5 kg   SpO2 99%   BMI 25.66 kg/m   Physical Exam Vitals and nursing note reviewed.  Constitutional:      Appearance: Normal appearance. He is well-developed.  HENT:     Head: Normocephalic and atraumatic.  Eyes:     Extraocular Movements: Extraocular movements intact.     Conjunctiva/sclera: Conjunctivae normal.     Pupils: Pupils are equal, round, and reactive to light.  Cardiovascular:     Rate and Rhythm: Normal rate and regular rhythm.     Heart sounds: No murmur heard. Pulmonary:  Effort: Pulmonary effort is normal. No respiratory distress.     Breath sounds: Normal breath sounds.  Abdominal:     Palpations: Abdomen is soft.     Tenderness: There is no abdominal tenderness. There is no guarding.     Comments: Tender left lower quadrant.  Musculoskeletal:        General: Normal range of motion.     Cervical back: Neck supple.  Skin:    General: Skin is warm and dry.     Capillary Refill: Capillary refill takes less than 2 seconds.  Neurological:     General: No focal deficit present.     Mental Status: He is alert and oriented to person, place, and time.    ED Results / Procedures / Treatments   Labs (all labs ordered are listed, but only  abnormal results are displayed) Labs Reviewed  LIPASE, BLOOD - Abnormal; Notable for the following components:      Result Value   Lipase 87 (*)    All other components within normal limits  COMPREHENSIVE METABOLIC PANEL - Abnormal; Notable for the following components:   Creatinine, Ser 1.29 (*)    AST 14 (*)    All other components within normal limits  CBC  URINALYSIS, ROUTINE W REFLEX MICROSCOPIC    EKG None  Radiology No results found.  Procedures Procedures   Medications Ordered in ED Medications - No data to display  ED Course  I have reviewed the triage vital signs and the nursing notes.  Pertinent labs & imaging results that were available during my care of the patient were reviewed by me and considered in my medical decision making (see chart for details).    MDM Rules/Calculators/A&P                           Patient's labs without any significant abnormality other than lipase elevated at 87.  Patient's pain seems to be made worse with movement.  There is tenderness left lower quadrant.  CT scan of abdomen will be done to rule out diverticulitis.  Based on the way the pain is occurred unlikely to be kidney stone.  But that is a possibility.  No leukocytosis.  Disposition will be based on CT scan.  CT scan without any acute findings.  Patient's abdominal pain may be musculoskeletal in nature.   Final Clinical Impression(s) / ED Diagnoses Final diagnoses:  Left lower quadrant abdominal pain    Rx / DC Orders ED Discharge Orders     None        Vanetta Mulders, MD 10/10/20 1402    Vanetta Mulders, MD 10/10/20 [phone]

## 2020-10-10 NOTE — Discharge Instructions (Addendum)
[facility]  [facility] scan had no findings inside the abdomen.  Suspect that this is abdominal wall pain.

## 2020-10-11 ENCOUNTER — Non-Acute Institutional Stay: Payer: BLUE CROSS/BLUE SHIELD

## 2020-10-11 DIAGNOSIS — Z9884 Bariatric surgery status: Secondary | ICD-10-CM

## 2020-10-21 ENCOUNTER — Non-Acute Institutional Stay: Payer: BLUE CROSS/BLUE SHIELD

## 2020-10-29 ENCOUNTER — Ambulatory Visit: Payer: BLUE CROSS/BLUE SHIELD

## 2020-10-29 DIAGNOSIS — Z0000 Encounter for general adult medical examination without abnormal findings: Secondary | ICD-10-CM

## 2020-10-29 DIAGNOSIS — Z1159 Encounter for screening for other viral diseases: Secondary | ICD-10-CM

## 2020-10-29 DIAGNOSIS — Z23 Encounter for immunization: Secondary | ICD-10-CM

## 2020-10-29 DIAGNOSIS — Z833 Family history of diabetes mellitus: Secondary | ICD-10-CM

## 2020-10-29 NOTE — H&P
PATIENT: Russell Wiggins  MRN: [id number]  DOB: 1975-11-30  DATE OF SERVICE: 10/29/2020    PRIMARY CARE PROVIDER: Barton Dubois, MD, Internal Medicine       CHIEF COMPLAINT:   Chief Complaint   Patient presents with     Chief Complaint   Patient presents with   ? Annual Exam       Subjective:      Russell Wiggins is 45 y.o. here for CPE    Plans to have gastric band removed  Married   Nonsmoker   tdap in 2015  Plans to get flu shot today     Past Medical History:   Diagnosis Date   ? Empyema (HCC/RAF)    ? Obesity      Past Surgical History:   Procedure Laterality Date   ? HIP FRACTURE SURGERY Right    ? LAPAROSCOPIC GASTRIC BANDING     ? LUNG SURGERY       Family History   Problem Relation Age of Onset   ? Lymphoma Father    ? Obesity Father    ? Osteoporosis Paternal Aunt    ? Obesity Paternal Uncle    ? Diabetes type II Maternal Grandfather    ? Parkinsonism Paternal Grandfather      Social History     Socioeconomic History   ? Marital status: Married   Tobacco Use   ? Smoking status: Never Smoker   ? Smokeless tobacco: Never Used   Substance and Sexual Activity   ? Alcohol use: No     Alcohol/week: 0.0 oz   ? Drug use: No   ? Sexual activity: Yes     Partners: Female   Other Topics Concern   ? Do you exercise at least a day, 3 or more days a week? No       No current outpatient medications on file.    There are no preventive care reminders to display for this patient.    Review of Systems:   A 14-system review of systems was performed and is negative except as stated in the history of present illness.     Objective:          Vitals:    Last Recorded Vital Signs:    10/29/20 1444   BP: 118/81   Pulse: 92   Resp: 18   Temp: 36.7 ?C (98 ?F)   SpO2: 97%        General:   alert, appears stated age and cooperative   Head:   Normocephalic, without obvious abnormality, atraumatic   Eyes:   conjunctivae/corneas clear. PERRL, EOM's intact.   Ears:   normal TM's and external ear canals both ears   Nose: Nares normal. Septum midline. Mucosa normal. No drainage or sinus tenderness.   Mouth/Throat:  No tonsillar enlargement, no tonsillar exudates   Neck:  no tenderness/mass/nodules   Cardiovascular: regular rate and rhythm, S1, S2 normal, no murmur, click, rub or gallop   Pulmonary:   clear to auscultation bilaterally   Abdomen:   soft, non-tender; bowel sounds normal   Back: symmetric, no curvature. ROM normal.   MSK/Extremities No lower extremity edema  Pulses palpable        Lab Review:   Reviewed       Assessment & Plan:        Russell Wiggins is 45 y.o. here for CPE     1. Preventative health care  - TSH with reflex FT4,  FT3; Future  - Lipid Panel; Future  - ALT (SGPT); Future  - CBC & Platelet Count; Future  - PSA,Screening; Future    2. Family history of diabetes mellitus  - Basic Metabolic Panel; Future  - Hgb A1c; Future    3. Need for influenza vaccination  - Influenza vaccine IM;   PF    4. Need for hepatitis B screening test  - HBS Antigen; Future      Health Maintenance   Topic Date Due   ? Hepatitis B Screening  Never done   ? Influenza Vaccine (1) 10/10/2020   ? Prediabetes Screening (See hover text)  10/03/2022   ? Tdap/Td Vaccine (2 - Td or Tdap) 08/30/2023   ? Colorectal Cancer Screening  09/27/2030   ? Hepatitis C Screening  Completed   ? COVID-19 Vaccine(Tracks primary and booster doses, not sup/immunocomp)  Completed   ? HIV Screening  Completed       Return to clinic: prn       The above recommendation were discussed with the patient including the risk and benefits of each medication and the potential side effects of each medication. The patient has all questions answered satisfactorily and is in agreement with this recommended plan of care.  In this >30 minute visit, spent >50% of time discussing above plan of care and counseling.      Barton Dubois, MD  10/29/2020

## 2020-10-30 ENCOUNTER — Non-Acute Institutional Stay: Payer: BLUE CROSS/BLUE SHIELD

## 2020-10-30 ENCOUNTER — Inpatient Hospital Stay: Payer: BLUE CROSS/BLUE SHIELD

## 2020-10-30 DIAGNOSIS — K219 Gastro-esophageal reflux disease without esophagitis: Secondary | ICD-10-CM

## 2020-11-02 ENCOUNTER — Non-Acute Institutional Stay: Payer: BLUE CROSS/BLUE SHIELD

## 2020-11-14 ENCOUNTER — Telehealth: Payer: BLUE CROSS/BLUE SHIELD

## 2020-11-14 NOTE — Telephone Encounter
Sent message regarding request to reschedule surgery date.

## 2020-11-15 ENCOUNTER — Telehealth: Payer: BLUE CROSS/BLUE SHIELD

## 2020-11-15 NOTE — Telephone Encounter
Left voicemail and sent message to confirm surgery date.

## 2020-11-18 ENCOUNTER — Ambulatory Visit: Payer: BLUE CROSS/BLUE SHIELD

## 2020-11-18 ENCOUNTER — Non-Acute Institutional Stay: Payer: BLUE CROSS/BLUE SHIELD

## 2020-11-18 DIAGNOSIS — Z01818 Encounter for other preprocedural examination: Secondary | ICD-10-CM

## 2020-11-20 ENCOUNTER — Non-Acute Institutional Stay: Payer: BLUE CROSS/BLUE SHIELD

## 2020-11-20 ENCOUNTER — Ambulatory Visit: Payer: BLUE CROSS/BLUE SHIELD

## 2020-11-21 ENCOUNTER — Telehealth: Payer: BLUE CROSS/BLUE SHIELD

## 2020-11-21 NOTE — Telephone Encounter
Sent message to patient received approval for surgery.

## 2020-11-22 ENCOUNTER — Telehealth: Payer: BLUE CROSS/BLUE SHIELD

## 2020-11-22 NOTE — Telephone Encounter
Left message for patient to contact office to schedule surgical date.

## 2020-12-05 ENCOUNTER — Non-Acute Institutional Stay: Payer: BLUE CROSS/BLUE SHIELD

## 2020-12-30 ENCOUNTER — Ambulatory Visit: Payer: BLUE CROSS/BLUE SHIELD

## 2020-12-30 ENCOUNTER — Non-Acute Institutional Stay: Payer: BLUE CROSS/BLUE SHIELD

## 2020-12-30 DIAGNOSIS — Z01818 Encounter for other preprocedural examination: Secondary | ICD-10-CM

## 2021-01-10 ENCOUNTER — Telehealth: Payer: BLUE CROSS/BLUE SHIELD

## 2021-01-10 NOTE — Telephone Encounter
Patient Phone Messages:     MD office returned call   x Patient returned call    Patient called with questions regarding surgery     Comments:

## 2021-01-12 ENCOUNTER — Telehealth: Payer: BLUE CROSS/BLUE SHIELD

## 2021-01-12 NOTE — Telephone Encounter
Received message pt needs preop authorization. Called to help schedule preop.n/a l/m to c/b

## 2021-01-13 ENCOUNTER — Ambulatory Visit: Payer: BLUE CROSS/BLUE SHIELD

## 2021-01-15 ENCOUNTER — Non-Acute Institutional Stay: Payer: BLUE CROSS/BLUE SHIELD

## 2021-01-15 DIAGNOSIS — Z01818 Encounter for other preprocedural examination: Secondary | ICD-10-CM

## 2021-01-16 ENCOUNTER — Ambulatory Visit: Payer: BLUE CROSS/BLUE SHIELD

## 2021-01-16 ENCOUNTER — Telehealth: Payer: BLUE CROSS/BLUE SHIELD

## 2021-01-16 NOTE — Telephone Encounter
Sent message pending Ekg and PCP clearing prior to surgery.

## 2021-02-11 ENCOUNTER — Non-Acute Institutional Stay: Payer: BLUE CROSS/BLUE SHIELD

## 2021-02-20 ENCOUNTER — Non-Acute Institutional Stay: Payer: BLUE CROSS/BLUE SHIELD

## 2021-02-25 ENCOUNTER — Telehealth: Payer: BLUE CROSS/BLUE SHIELD

## 2021-02-25 ENCOUNTER — Ambulatory Visit: Payer: BLUE CROSS/BLUE SHIELD

## 2021-02-25 NOTE — Telephone Encounter
Call Back Request      Reason for call back: pt would like to schedule ecg before 2/3 procedure, pcc does not schedule. Please assist    Cb: [phone]    Any Symptoms:  []  Yes  [x]  No       If yes, what symptoms are you experiencing:    o Duration of symptoms (how long):    o Have you taken medication for symptoms (OTC or Rx):      If call was taken outside of clinic hours:    [] Patient or caller has been notified that this message was sent outside of normal clinic hours.     [] Patient or caller has been warm transferred to the physician's answering service. If applicable, patient or caller informed to please call back if symptoms progress.  Patient or caller has been notified of the turnaround time of 1-2 business day(s).

## 2021-02-25 NOTE — Telephone Encounter
Reached out to PT and made requested ECG appt for a preop.

## 2021-02-26 ENCOUNTER — Non-Acute Institutional Stay: Payer: BLUE CROSS/BLUE SHIELD

## 2021-02-26 ENCOUNTER — Ambulatory Visit: Payer: BLUE CROSS/BLUE SHIELD

## 2021-02-26 DIAGNOSIS — Z01818 Encounter for other preprocedural examination: Secondary | ICD-10-CM

## 2021-03-05 ENCOUNTER — Institutional Professional Consult (permissible substitution): Payer: BLUE CROSS/BLUE SHIELD

## 2021-03-05 ENCOUNTER — Ambulatory Visit: Payer: BLUE CROSS/BLUE SHIELD

## 2021-03-05 DIAGNOSIS — Z01818 Encounter for other preprocedural examination: Secondary | ICD-10-CM

## 2021-03-06 ENCOUNTER — Ambulatory Visit: Payer: BLUE CROSS/BLUE SHIELD

## 2021-03-06 ENCOUNTER — Telehealth: Payer: BLUE CROSS/BLUE SHIELD

## 2021-03-06 NOTE — Telephone Encounter
Sent message to patient received approval for surgery.

## 2021-03-12 ENCOUNTER — Institutional Professional Consult (permissible substitution): Payer: BLUE CROSS/BLUE SHIELD

## 2021-03-12 DIAGNOSIS — Z01818 Encounter for other preprocedural examination: Secondary | ICD-10-CM

## 2021-03-13 ENCOUNTER — Ambulatory Visit: Payer: BLUE CROSS/BLUE SHIELD

## 2021-03-13 DIAGNOSIS — L989 Disorder of the skin and subcutaneous tissue, unspecified: Secondary | ICD-10-CM

## 2021-03-13 DIAGNOSIS — M79671 Pain in right foot: Secondary | ICD-10-CM

## 2021-03-13 DIAGNOSIS — H539 Unspecified visual disturbance: Secondary | ICD-10-CM

## 2021-03-13 DIAGNOSIS — M79672 Pain in left foot: Secondary | ICD-10-CM

## 2021-03-13 LAB — COVID-19 PCR/TMA

## 2021-03-13 NOTE — Progress Notes
Name: Cage Gupton  Medical Record Number: [id number]  Date of Service: 03/13/2021    Chief Complaint:   Chief Complaint   Patient presents with    kaleidoscope vision     X 1 week ago        History of Present Illness: Theresa Dohrman is a 46 y.o. male who presents with request for several referrals:  1. Kaleidoscope vision for 20 minutes about 1 week ago. No headaches, wants to see ophtho.  2. Obesity: wants to see dietician   3. Skin lesions: wants to see derm  4. Foot pain: wants to f/u with podiatry    Past Medical History:   Diagnosis Date    Empyema (HCC/RAF)     Obesity      Review of Systems:   Gen - no acute distress  CV - no chest pain, no palpitations  Resp - no shortness of breath, no dyspnea  GI - no abdominal pain, diarrhea, constipation  Skin - no rash    Physical Exam:   Vitals:   Last Recorded Vital Signs:    03/13/21 1423   BP: 131/89   Pulse: 90   Temp: 36.4 C (97.5 F)        General: No acute distress,  HEENT: Normocephalic, atraumatic, PERRL, conjunctiva/corneas clear, EOM's intact bilaterally  Neurologic: No focal neurologic deficits    Assessment and Plan:   The patient is a 46 y.o. year old male with:    Diagnoses and all orders for this visit:    Vision changes  Ulice Bold [facility] Referral to Ophthalmology    Obesity, morbid, BMI 40.0-49.9 (HCC/RAF)  -     Referral to Clinical Nutrition - Medical Nutrition Counseling including Weight Loss    Skin lesion  -     ** AMBULATORY REFERRAL TO DERMATOLOGY    Pain in both feet  Ulice Bold [facility] Referral to Podiatry      Marcelene Butte, MD  [facility]  [facility]

## 2021-03-14 MED ADMIN — EPHEDRINE SULFATE (PRESSORS) 50 MG/ML IV SOLN: INTRAVENOUS | @ 16:00:00 | Stop: 2021-03-14 | NDC 51754420004

## 2021-03-14 MED ADMIN — PLASMA-LYTE A IV SOLN: @ 16:00:00 | Stop: 2021-03-14

## 2021-03-14 MED ADMIN — HYDROMORPHONE 10 MG/50 ML PCA SYRINGE (503B)(MULTI GPI): 50 mL | INTRAVENOUS | @ 19:00:00 | Stop: 2021-03-15 | NDC 73177010405

## 2021-03-14 MED ADMIN — ACETAMINOPHEN 10 MG/ML IV SOLN: 1000 mg | INTRAVENOUS | @ 18:00:00 | Stop: 2021-03-14 | NDC 63323043441

## 2021-03-14 MED ADMIN — IDS 19-000496 SUGAMMADEX SM 100 MG/ML INJECTION: 273 mg | INTRAVENOUS | @ 20:00:00 | Stop: 2021-03-14

## 2021-03-14 MED ADMIN — ESMOLOL HCL 100 MG/10ML IV SOLN: INTRAVENOUS | @ 16:00:00 | Stop: 2021-03-14 | NDC 63323065210

## 2021-03-14 MED ADMIN — ROCURONIUM BROMIDE 50 MG/5ML IV SOLN: INTRAVENOUS | @ 17:00:00 | Stop: 2021-03-14 | NDC 39822420002

## 2021-03-14 MED ADMIN — HYDROMORPHONE HCL 1 MG/ML IJ SOLN: INTRAVENOUS | @ 17:00:00 | Stop: 2021-03-14 | NDC 00409128331

## 2021-03-14 MED ADMIN — BUPIVACAINE HCL (PF) 0.25 % IJ SOLN: @ 18:00:00 | Stop: 2021-03-14 | NDC 00409115901

## 2021-03-14 MED ADMIN — LACTATED RINGERS IV SOLN: 100 mL/h | INTRAVENOUS | @ 18:00:00 | Stop: 2021-03-16 | NDC 00338011704

## 2021-03-14 MED ADMIN — PLASMA-LYTE A IV SOLN: INTRAVENOUS | @ 16:00:00 | Stop: 2021-03-14 | NDC 00338022104

## 2021-03-14 MED ADMIN — FENTANYL CITRATE (PF) 100 MCG/2ML IJ SOLN: INTRAVENOUS | @ 16:00:00 | Stop: 2021-03-14 | NDC 00409909422

## 2021-03-14 MED ADMIN — HEPARIN SODIUM (PORCINE) 1000 UNIT/ML IJ SOLN: SUBCUTANEOUS | @ 16:00:00 | Stop: 2021-03-14 | NDC 00409272002

## 2021-03-14 MED ADMIN — PROPOFOL 200 MG/20ML IV EMUL: INTRAVENOUS | @ 16:00:00 | Stop: 2021-03-14 | NDC 63323026929

## 2021-03-14 MED ADMIN — DEXAMETHASONE SODIUM PHOSPHATE 4 MG/ML IJ SOLN: INTRAVENOUS | @ 16:00:00 | Stop: 2021-03-14 | NDC 67457042312

## 2021-03-14 MED ADMIN — ESMOLOL HCL 100 MG/10ML IV SOLN: INTRAVENOUS | @ 17:00:00 | Stop: 2021-03-14 | NDC 63323065210

## 2021-03-14 MED ADMIN — ONDANSETRON HCL 4 MG/2ML IJ SOLN: INTRAVENOUS | @ 17:00:00 | Stop: 2021-03-14 | NDC 60505613005

## 2021-03-14 MED ADMIN — SUCCINYLCHOLINE CHLORIDE 20 MG/ML IJ SOLN: INTRAVENOUS | @ 16:00:00 | Stop: 2021-03-14 | NDC 00781341195

## 2021-03-14 MED ADMIN — ROCURONIUM BROMIDE 50 MG/5ML IV SOLN: INTRAVENOUS | @ 16:00:00 | Stop: 2021-03-14 | NDC 39822420002

## 2021-03-14 MED ADMIN — MIDAZOLAM HCL 10 MG/10ML IJ SOLN: INTRAVENOUS | @ 16:00:00 | Stop: 2021-03-14 | NDC 00409258705

## 2021-03-14 MED ADMIN — HYDROMORPHONE HCL 1 MG/ML IJ SOLN: .4 mg | INTRAVENOUS | @ 18:00:00 | Stop: 2021-03-14 | NDC 00409128331

## 2021-03-14 MED ADMIN — PHENYLEPHRINE HCL 10 MG/ML IV SOLN: INTRAVENOUS | @ 17:00:00 | Stop: 2021-03-14 | NDC 70121157705

## 2021-03-14 MED ADMIN — SUGAMMADEX SODIUM 200 MG/2ML IV SOLN: INTRAVENOUS | @ 17:00:00 | Stop: 2021-03-14 | NDC 00006542312

## 2021-03-14 MED ADMIN — CEFOXITIN SODIUM 1 G IV SOLR: INTRAVENOUS | @ 16:00:00 | Stop: 2021-03-14 | NDC 25021010910

## 2021-03-14 MED ADMIN — LIDOCAINE HCL (CARDIAC) 100 MG/5ML IV SOSY: INTRAVENOUS | @ 16:00:00 | Stop: 2021-03-14 | NDC 76329339001

## 2021-03-14 MED ADMIN — PANTOPRAZOLE SODIUM 40 MG IV SOLR: 40 mg | INTRAVENOUS | @ 20:00:00 | Stop: 2021-03-16 | NDC 55150020200

## 2021-03-14 NOTE — H&P
UPDATED H&P REQUIREMENT    For [facility] and [facility]    WHAT IS THE STATUS OF THE PATIENT'S MOST CURRENT HISTORY AND PHYSICAL?   - The most current H&P was performed within the past 24 hours. No additional updated H&P documentation is necessary.     REFER TO MEDICAL STAFF POLICIES REGARDING PRE-PROCEDURE HISTORY AND PHYSICAL EXAMINATION AND UPDATED H&P REQUIREMENTS BELOW:    [facility] and [facility] Medical Staff Policy 200 - For Patients Undergoing Procedures Requiring Moderate or Deep Sedation, General Anesthesia or Regional Anesthesia    Contents of a History and Physical Examination (H&P):    The H&P shall consist of chief complaint, history of present illness, allergies and medications, relevant social and family history, past medical history, review of systems and physical examination, and assessment and plan appropriate to the patient's age.    For Patients Undergoing Procedures Requiring Moderate or Deep Sedation, General Anesthesia or Regional Anesthesia:    1. An H&P shall be performed within 24 hours prior to the procedure by a qualified member of the medical staff or designee with appropriate privileges, except as noted in item 2 below.    2. If a complete history and physical was performed within thirty (30) calendar days prior to the patient's admission to the Medical Center for elective surgery, a member of the medical staff assumes the responsibility for the accuracy of the clinical information and will need to document in the medical record within twenty-four (24) hours of admission and prior to surgery or major invasive procedure, that they either attest that the history and physical has been reviewed and accepted, or document an update of the original history and physical relevant to the patient's current clinical status.    3. Providing an H&P for  patients undergoing surgery under local anesthesia is at the discretion of the Attending Physician.     4. When a procedure is performed by a dentist, podiatrist or other practitioner who is not privileged to perform an H&P, the anesthesiologist's assessment immediately prior to the procedure will constitute the 24 hour re-assessment.The dentist, podiatrist or other practitioner who is not privileged to perform an H&P will document the history and physical relevant to the procedure.    5. If the H&P and the written informed consent for the surgery or procedure are not recorded in the patient's medical record prior to surgery, the operation shall not be performed unless the attending physician states in writing that such a delay could lead to an adverse event or irreversible damage to the patient.    6. The above requirements shall not preclude the rendering of emergency medical or surgical care to a patient in dire circumstances.

## 2021-03-14 NOTE — H&P
PATIENT:  Russell Wiggins  MRN:  [id number]  DOB:  1975-05-22  DATE OF SERVICE:  03/14/2021    CHIEF COMPLAINT: I have GERD    HPI:  Russell Wiggins is a 46 y.o. male with a history of sleep apnea, arthritis and obesity here today for removal of gastric band due to GERD.     Denies fevers, chills, nausea, vomiting, or recent changes in their health.    Past Medical History:   Diagnosis Date   ? Empyema (HCC/RAF)    ? Obesity        Past Surgical History:   Procedure Laterality Date   ? HIP FRACTURE SURGERY Right    ? LAPAROSCOPIC GASTRIC BANDING     ? LUNG SURGERY            Current Facility-Administered Medications:   ?  plasma-lyte-A IV soln, , , ,         No Known Allergies      Family History   Problem Relation Age of Onset   ? Lymphoma Father    ? Obesity Father    ? Diabetes type II Maternal Grandfather    ? Parkinsonism Paternal Grandfather    ? Osteoporosis Paternal Aunt    ? Obesity Paternal Uncle    ? Anesthesia problems Neg Hx    ? Malignant hypertension Neg Hx    ? Malignant hyperthermia Neg Hx    ? Hypotension Neg Hx    ? Pseudochol deficiency Neg Hx        Social History     Socioeconomic History   ? Marital status: Married   Tobacco Use   ? Smoking status: Never   ? Smokeless tobacco: Never   Substance and Sexual Activity   ? Alcohol use: Yes     Comment: one drink/week   ? Drug use: No   ? Sexual activity: Yes     Partners: Female   Other Topics Concern   ? Do you exercise at least a day, 3 or more days a week? No         Review of Systems:  Negative except that mentioned in HPI.    Physical Exam:  Vitals:   Last Recorded Vital Signs:    03/14/21 0612   BP: 116/85   Pulse: 86   Resp: 18   Temp: 36.3 ?C (97.3 ?F)   SpO2: 100%     HEENT: Extra-occular muscles intact  Lungs: Breathing comfortably on room air  Heart: Regular rate on monitor.  Back: Negative  Abdomen:  soft, non-tender, without masses or organomegaly  Musculoskeletal: Normal  Extremities: Warm and well perfused  Neuro: Alert, oriented, appropriate mood    Labs:   Lab Results   Component Value Date    NA 141 11/20/2020    K 4.5 11/20/2020    CL 106 11/20/2020    CO2 27 11/20/2020    BUN 12 11/20/2020    CREAT 0.95 11/20/2020    GLUCOSE 90 11/20/2020    GLUCOSE 84 08/29/2013    CALCIUM 9.5 11/20/2020     Lab Results   Component Value Date    WBC 8.06 11/20/2020    HGB 15.4 11/20/2020    HCT 46.6 11/20/2020    MCV 95.5 11/20/2020    PLT 232 11/20/2020     Lab Results   Component Value Date    PT 10.8 12/06/2015    INR 1.1 12/06/2015     {Last Hepatic  Function Panel:    {Last Complete Metabolic Panel:   Results for orders placed or performed in visit on 10/03/19   Comprehensive Metabolic Panel   Result Value Ref Range    Sodium 141 135 - 146 mmol/L    Potassium 4.8 3.6 - 5.3 mmol/L    Chloride 104 96 - 106 mmol/L    Total CO2 27 20 - 30 mmol/L    Anion Gap 10 8 - 19 mmol/L    Glucose 84 65 - 99 mg/dL    Creatinine 1.61 0.96 - 1.30 mg/dL    GFR Estimate for African American >89 See GFR Additional Information mL/min/1.32m2    GFR Estimate for Non-African American >89 See GFR Additional Information mL/min/1.43m2    GFR Additional Information See Comment     Urea Nitrogen 11 7 - 22 mg/dL    Calcium 9.4 8.6 - 04.5 mg/dL    Total Protein 7.0 6.1 - 8.2 g/dL    Albumin 4.3 3.9 - 5.0 g/dL    Bilirubin,Total 0.3 0.1 - 1.2 mg/dL    Alkaline Phosphatase 57 37 - 113 U/L    Aspartate Aminotransferase 31 13 - 62 U/L    Alanine Aminotransferase 42 8 - 70 U/L       Imaging:  No imaging has been resulted in the last 30 days     Assessment:  Russell Wiggins is a 46 y.o. male with a history of sleep apnea, arthritis and obesity here today for removal of gastric band due to GERD.     RECOMMENDATIONS/PLAN:  - Plan to proceed with Laparoscopic possible open removal of Lap band.   - Consented  - Marked    Faron Tudisco and family members have had all questions answered satisfactorily and are in agreement with this recommended plan of care. Author:  Lawana Pai, DDS, MD  General Surgery

## 2021-03-14 NOTE — Progress Notes
Pharmaceutical Services - Admission Medication Reconciliation Note      Patient Name: Russell Wiggins  Medical Record Number: [id number]  Admit date: 03/14/2021 10:07 AM    Age: 46 y.o.  Sex: male  Allergies: No Known Allergies  Height:   Most recent documented height   03/14/21 1.676 m (5' 6'')     Actual Weight:   Most recent documented weight   03/14/21 (!) 136.4 kg   10/29/20 (!) 136.1 kg     Diagnosis: The patient is currently admitted with the following concerns/issues: Principal Problem:    History of laparoscopic adjustable gastric banding POA: Not Applicable      Reported Medication History   I spoke with the patient over the phone to update the home medication list for this hospital admission.    Patient reported no prescription medications, eye drops, topicals, injectables, other OTCs or supplements prior to this hospital admission.       PTA Medication List (discrepancies are noted)   No medications prior to admission.       Discharge Prescription Preference:   [id number] [place], [place] - [address]  [place]  [place] [address]        The patient's allergies and medications have been reviewed and updated.     The reconciliation of admission orders with PTA med list is complete. This note represents our good faith effort to obtain the best possible medication history from all attainable sources at the time of reconciliation.      There are no issues requiring follow up at this time.     L. , PharmD, 03/14/2021, 11:59 AM

## 2021-03-14 NOTE — Op Note
PRE-OP  DIAGNOSES:  1) Morbid obesity (BMI 49)  2) Nausea and vomiting   3) S/p lap-band procedure    POST-OP DIAGNOSIS:       PROCEDURES PERFORMED:  1) Laparoscopic Lap-band removal  2) Escharotomy  3) Intraoperative EGD     DATE of the PROCEDURE:  03/14/2021    SURGEON:  Camie Patience, MD    ASSISTANT:  Larose Hires, MD     ANESTHESIA: General    ESTIMATED BLOOD LOSS:  20 ml.    INDICATIONS FOR SURGERY:  The patient has struggled with morbid obesity along with the obesity-related co-morbidities listed above. The patient had a lap-band placed in the past. Because of nausea/vomiting and weight regain, the patient wanted to have the lap-band removed.     FINDINGS:   The scar tissue at the GE junction was very thick. It was very difficult to cut the plication stitches.       DESCRIPTION OF PROCEDURE:     The patient was brought to the Operating Theater and placed supine on the operating table. Sequential compression stockings were placed. Subcutaneous heparin was administered in the pretreatment unit prior to arrival in the Operating Room. An endotracheal anesthesia was then induced.  IV antibiotics were administered within 1 hour of incision time. A Bair Hugger device was used to maintain normothermia throughout the case and no hair removal was necessary during this procedure.    The abdomen was prepped and draped in the standard surgical fashion, and an optical trocar was used to enter the peritoneal space under videoscopic guidance. After this had been accomplished, the abdomen was insufflated to a level of with carbon dioxide. Initial inspection of the abdomen showed no obvious abnormality other than adhesions associated with the lapband tubing and in the left subhepatic area of the proximal stomach. Additional working trocars were placed under videoscopic guidance as well as a liver retractor. Meticulous adhesiolysis was undertaken in the region of the gastrogastric cuff around the lapband until the buckle was exposed. The tubing was cut with scissors, and the buckle was undone. The band was removed and passed off the field. The plicaiton stitches were all identified. We tried to cut the plication stitches with scissors and the scissors wound not cut. It seemed that there was very thick scar tissue around the stitches. We used electrocautery and Sonicision and both would not cut the plication stitches. We had to cut the scar tissue around it to separate the plicated stomach. There was thick scar tissue identified at the GE junction around the Lap-band insertion site. Electrocautery was used to cut the the scar tissue at the anterior surface of the GE junction. We didn't remove all the scar tissue. Instead, the center of the scar capsule was cut open and the escharotomy was performed to relieve the constriction at the GE junction.      A gastroscope was inserted into the stomach. Careful inspection of the stomach revealed no injury to the stomach. Air leak test was negative. After this was accomplished. an incision was made over the port, and the capsule around it was opened. The fascial attachments were lysed and the tubing was taken out from the peritoneal space taking care to remove all pieces of the system. The port and tubing were then passed off the field. The incision was irrigated and closed in a layered fashion with absorbable suture.    Laparoscope was re-introduced into the abdomen and no remaining foreign bodies found. The liver retractor  and trocars were removed under videoscopic guidance. The 15 mm port was closed with 0 Vicryl. The carbon dioxide was allowed to escape from the abdomen prior to the removal of the final trocar. Each of the skin incisions was closed with 4-0 Monocryl in a subcuticular fashion. Marcaine was injected for postoperative pain control. Steri-Strips and sterile dressings were placed. The patient tolerated the procedure well, was discharged awake, alert to the Recovery Room in stable condition. All needle and sponge counts were correct at the end of the case.    ATTESTATION:  I attest I am the attending surgeon for this case and was present for all portions of it. Additionally, I attest that all appropriate SCIP measures were employed to reduce the likelihood of postoperative complication.

## 2021-03-14 NOTE — Op Note
----------------------------------------------------------------------------------------------------------------------  (THE FOLLOWING IS FOR NURSING REFERENCE AND HAS BEEN PULLED FROM THE CURRENT CHART. PACU Phone: [phone])    Procedure(s) (LRB):  LAPAROSCOPIC BAND REMOVAL (N/A)  UPPER GI ENDOSCOPY W/ BIOPSY (N/A)  Gastroesophageal reflux disease, unspecified whether esophagitis present [K21.9]  History of laparoscopic adjustable gastric banding [W11.91]  Treatment Team     Provider Relationship Specialty Contact    Camie Patience, MD Attending Surgery, Bariatric   [phone]    [phone]      Bariatric, Surgery - Team --   [phone]    [phone]          __________________________________________________________________________________    History  Past Medical History:   Diagnosis Date   ? Empyema (HCC/RAF)    ? Obesity      Past Surgical History:   Procedure Laterality Date   ? HIP FRACTURE SURGERY Right    ? LAPAROSCOPIC GASTRIC BANDING     ? LUNG SURGERY       __________________________________________________________________________________    Labs  No results for input(s): WBC, HCT, HGB, PLT, PT, APTT, INR, NA, K, CL, CO2, BUN, CREAT, GLUCOSE, MG, PHOS, ICALCOR, GLUCOSEPOC in the last 72 hours.  __________________________________________________________________________________    Vital Signs  Last Recorded Vital Signs:    03/14/21 1030   BP: 146/89   Pulse: (!) 104   Resp: 16   Temp:    SpO2: 97%     @LASTETCO2 @  Temp Readings from Last 1 Encounters:   03/14/21 36.1 ?C (97 ?F)       Pain Information (Last Filed)     Score Location Comments Edu?      4 None None None        __________________________________________________________________________________    Intake and Output  No intake/output data recorded.  I/O this shift:  In: 1300 [I.V.:1300]  Out: -      __________________________________________________________________________________    IV Drips/Fluids/PCA:   ? HYDROmorphone in sodium chloride      And   ? sodium chloride     ? lactated ringers 100 mL/hr (03/14/21 1029)     __________________________________________________________________________________    Lines, Drains, and Airways     Peripheral IV 20 G Left Hand (Active)     __________________________________________________________________________________    ----------------------------------------------------------------------------------------------------------------------      PACU NursingTransfer Note  10:39 AM, 03/14/2021      Isolation? No    Antibiotics in OR:  Last Antibiotic (last 24 hours) Showing orders from other encounters    Date/Time Action Medication Dose    03/14/21 0753 Given    cefOXitin inj 3 g        Last Antiemetic:   Med Administrations and Associated Flowsheet Values (last 4 hours) Showing orders from other encounters    Date/Time Action Medication Dose    03/14/21 0925 Given    ondansetron 4 mg/2 mL inj 4 mg        Acetaminophen given @:  Med Administrations and Associated Flowsheet Values (last 24 hours)     Date/Time Action Medication Dose    03/14/21 1029 Given    acetaminophen IV inj 1,000 mg 1,000 mg        Last pain medication:   Pain Meds (last 4 hours) Showing orders from other encounters    Date/Time Action Medication Dose    03/14/21 1029 Given    acetaminophen IV inj 1,000 mg 1,000 mg    03/14/21 1019 Given    HYDROmorphone 1 mg/mL inj 0.4 mg 0.4 mg  03/14/21 0938 Given    bupivacaine PF 0.25% inj 60 mL    03/14/21 0928 Given    HYDROmorphone 1 mg/mL inj 0.4 mg    03/14/21 1191 Given    HYDROmorphone 1 mg/mL inj 0.4 mg    03/14/21 0747 Given    fentaNYL (PF) 100 mcg/2 mL inj 100 mcg    03/14/21 0747 Given    propofol 200 mg/20 mL inj 200 mg        Txp Anti-rejection medication:  Anti-rejection meds. (last 24 hours) Showing orders from other encounters    Date/Time Action Medication Dose    03/14/21 0747 Given    dexamethasone 4 mg/mL inj 10 mg          Does the patient use prescription pain medication at home (prior to admission)?Marland KitchenMarland KitchenMarland KitchenNo    Pain level: Acceptable for patient? Yes    Difficult Airway? No    Respiratory is at baseline? Yes    Has the patient received Flumazenil or Narcan in PACU? No  (if ''Yes'', must be at least 45 minutes prior to transfer)     Aldrete: 10    Level of Consciousness:  Awake, Alert, Oriented x four    Cardiac Rhythm?  Normal Sinus    Surgical Site: Intact and Dry or with Minimal Drainage  Lines, Drains, and Airways     Wound  Duration             Incision 03/14/21 Other (Comment) Abdomen <1 day               Diet: Clear Liquids    Tolerating liquids: Undetermined    Activity: MAE: Has not ambulated    Voided:  Due to Void    Significant Other Location:  at home    Will Transport to: Floor                       With: RN & Monitor    Continuity of Care Issues from OR or PACU:   None

## 2021-03-15 LAB — Basic Metabolic Panel: POTASSIUM: 3.9 mmol/L (ref 3.6–5.3)

## 2021-03-15 LAB — Phosphorus: PHOSPHORUS: 3.5 mg/dL (ref 2.3–4.4)

## 2021-03-15 LAB — Magnesium: MAGNESIUM: 1.7 meq/L (ref 1.4–1.9)

## 2021-03-15 LAB — CBC: MEAN CORPUSCULAR VOLUME: 92.1 fL (ref 79.3–98.6)

## 2021-03-15 MED ORDER — OXYCODONE HCL 5 MG PO TABS
5 mg | ORAL_TABLET | Freq: Four times a day (QID) | ORAL | 0 refills | 3.00 days | Status: AC | PRN
Start: 2021-03-15 — End: ?
  Filled 2021-03-16: qty 12, 3d supply, fill #0

## 2021-03-15 MED ORDER — ACETAMINOPHEN 325 MG PO TABS
650 mg | ORAL_TABLET | Freq: Four times a day (QID) | ORAL | 0 refills | 10 days | Status: AC | PRN
Start: 2021-03-15 — End: ?
  Filled 2021-03-16: qty 80, 10d supply, fill #0

## 2021-03-15 MED ADMIN — PANTOPRAZOLE SODIUM 40 MG IV SOLR: 40 mg | INTRAVENOUS | @ 20:00:00 | Stop: 2021-03-16 | NDC 55150020200

## 2021-03-15 MED ADMIN — MULTIVITAMIN PO LIQD: 15 mL | ORAL | @ 20:00:00 | Stop: 2021-03-16

## 2021-03-15 MED ADMIN — ENOXAPARIN SODIUM 40 MG/0.4ML IJ SOSY: 40 mg | SUBCUTANEOUS | @ 06:00:00 | Stop: 2021-03-16 | NDC 71288041082

## 2021-03-15 MED ADMIN — PANTOPRAZOLE SODIUM 40 MG IV SOLR: 40 mg | INTRAVENOUS | @ 08:00:00 | Stop: 2021-03-16 | NDC 55150020200

## 2021-03-15 NOTE — Progress Notes
COLORECTAL SURGERY INPATIENT PROGRESS NOTE    PATIENT: Russell Wiggins  MRN: [id number]  ATTENDING PHYSICIAN: Camie Patience, MD  ADMISSION DIAGNOSIS:  Gastroesophageal reflux disease, unspecified whether esophagitis present [K21.9]  History of laparoscopic adjustable gastric banding [Z98.84]  Obesity [E66.9]  DATE OF SERVICE: 03/15/2021  LENGTH OF STAY:  LOS: 1 day  1 Day Post-Op     ID: Russell Wiggins is a 46 y.o. male with a history of sleep apnea, arthritis and obesity who had removal of gastric band due to GERD on 03/14/2021.     INTERVAL HISTORY   03/15/21: POD#1. Tolerating CLD, denies uncontrolled pain, No acute overnight events. No CP/SOB, N/V.                                                      OBJECTIVE   Vital Signs: Temp:  [36.2 ?C (97.2 ?F)-36.8 ?C (98.2 ?F)] 36.6 ?C (97.9 ?F)  Heart Rate:  [80-119] 80  Resp:  [13-20] 18  BP: (121-145)/(76-92) 144/76  NBP Mean:  [90-108] 90  SpO2:  [92 %-96 %] 96 %  Vent Settings:    I&O:   I/O last 3 completed shifts:  In: 3041.7 [P.O.:720; I.V.:2201.7; Other:20; IV Piggyback:100]  Out: 1900 [Urine:1900]    Intake/Output Summary (Last 24 hours) at 03/15/2021 1043  Last data filed at 03/15/2021 0900  Gross per 24 hour   Intake 2221.67 ml   Output 2650 ml   Net -428.33 ml        Physical Exam  GEN: No acute distress  NEURO: Grossly intact without any focal deficits.  HEENT: NCAT, EOMI.   CV: WNL, no evidence of chest pain  PULM: Normal respiratory effort on RA.   ABD: Soft, non tender, non distended. No hernias or masses palpated.   INCISION: Well approximated. Non-purulent, non-erythematous, non-indurated. Incisions dressed with OR dressing, c/d/I.  GU: Voiding spontaneously   EXTREMITIES: Moves all extremities well. Warm, well perfused, no edema     Lab Review:    WBC/Hgb/Hct/Plts:  14.88/14.0/42.0/224 (02/04 0354)   Na/K/Cl/CO2/BUN/Cr/glu:  140/3.9/106/23/10/0.79/104 (02/04 0354)          Imaging Review: None    Medication Review:  Current Facility-Administered Medications   Medication Dose Route Frequency   ? enoxaparin 40 mg/0.4 mL inj 40 mg  40 mg Subcutaneous Q24H   ? lactated ringers IV soln  100 mL/hr Intravenous Continuous   ? metoprolol 5 mg in dextrose 5% 50 mL IVPB  5 mg Intravenous Q6H PRN   ? multivitamin liquid 15 mL  15 mL Oral Daily   ? ondansetron tab 4 mg  4 mg Oral Q8H PRN   ? oxyCODONE 5 mg/5 mL soln 5 mg  5 mg Oral Q6H PRN   ? pantoprazole inj 40 mg  40 mg IV Push Q12H   ? prochlorperazine tab 10 mg  10 mg Oral Q6H PRN   ? tamsulosin cap 0.4 mg  0.4 mg Oral Once PRN         ASSESSMENT AND PLAN   Russell Wiggins is a 46 y.o. male with a history of sleep apnea, arthritis and obesity who had removal of gastric band due to GERD on 03/14/2021. Pt is meeting all milestones for discharge today. His pain is well controlled on oral pain medication, he is voiding appropriately,  ambulating and able to take in clear liquid diet.   1 Day Post-Op    LOS: 1 day      NEURO  #Acute post operative pain  ? Pain is well controlled  ? D/c PCEA and start patient on Oxy 5mg   ? Acetaminophen 650mg  QID   ? Multimodal pain regimen     PSYCH  No active issues.     RESPIRATORY  No active issues.  ? Incentive spirometry     CARDIOVASCULAR  No active issues.  ? Goal SBP 90-160  ? Keep K >4 and Mg >2     GU / RENAL  No active issues.  ? Continue to monitor UOP     ID  No active issues.   ? Antibiotics: ancef, Last dose: completed  ? No abx as of 03/15/21     HEME / ONC  No active issues.  ? Keep Hgb >8  ? Continue to monitor H&H  ? DVT prophylaxis: enox, SCDs     ENDOCRINE  No active issues.  ? Glucose goal 100-140      GI / HEPATIC  No active issues.  ? Diet: CLD. Pt will continue CLD for 3 weeks post discharge and supplement with protein shakes  ? Nausea management: zofran PRN    PPx:  - DVT PPx  - Ulcer Ppx  - SCDs, ambulate TID    Dispo: Home today 03/15/21   - PT/OT eval for postop mobilization, dispo recs   - Home needs: None    The patient was examined and discussed with chief resident and surgical attending Dr. Imogene Burn who agrees with assessment and plans listed above.  Author:    Lawana Pai, DDS, MD  [facility] PGY4  Colorectal Surgery Service  p [phone](service)

## 2021-03-15 NOTE — Other
Patient's Clinical Goal:   Clinical Goal(s) for the Shift: comfort, pain control, v/s stable, po tolerance, safety for shift duration  Identify possible barriers to advancing the care plan: Stability of the patient: Moderately Stable - low risk of patient condition declining or worsening   Progression of Patient's Clinical Goal: POD#1 LAPAROSCOPIC BAND REMOVAL     Pt remains AAOx4. V/s stable on RA. NSR-ST (80-110's) on tele monitor. Pt reports pain controlled with hydromorphone PCA. Clear liquids tolerated with no c/o n/v. IVF discontinued per provider order secondary to PO intake >400 ml. Telfa/tegaderm dressings to abd lap sites remains in clean, dry, intact. Bowel sounds remains hypoactive and pt denies flatus during shift. Pt voiding and ambulating without difficulty. Safety maintained for shift duration.

## 2021-03-15 NOTE — Other
Patient's Clinical Goal:   Clinical Goal(s) for the Shift: Stable VS, Stable Cardiac Rhythm, Pain < 4/10, No Nausea/Vomiting, Ambulate, Void  Identify possible barriers to advancing the care plan: None  Stability of the patient: Moderately Stable - low risk of patient condition declining or worsening   Progression of Patient's Clinical Goal: Pt oriented to unit and call light. Pt remains alert & oriented x 4. Pt in no apparent distress and states that pain is controlled with PCA hydromorphone. Pt remains in normal sinus rhythm to sinus tachycardia and is on room air; denies any shortness of breath. Pt with LR @ 100 mL/hr continuously. Pt is ambulatory in hallway without assistive device, has steady gait. Pt voiding without issue. Pt tolerating clear liquid diet without nausea or vomiting. Pt demonstrated understanding of incentive spirometer. Pt states that they have no further requests at this time.

## 2021-03-15 NOTE — Other
Patient's Clinical Goal:   Clinical Goal(s) for the Shift: Stable VS, Stable Cardiac Rhythm, HR < 90, Pain < 4/10, Ambulate, Tolerate PO Intake  Identify possible barriers to advancing the care plan: None  Stability of the patient: Moderately Stable - low risk of patient condition declining or worsening   Progression of Patient's Clinical Goal: Pt remains alert & oriented x 4. Pt in no apparent distress and denies any need for pain medication. Pt remains in normal sinus rhythm and is on room air; denies any shortness of breath. Pt is ambulatory in hallway without assistive device, has steady gait. Pt tolerating PO intake without nausea or vomiting. Pt voiding without issue. Review of AVS: discharge instructions, incision care, follow up appointment, and medication regimen completed with pt; pt verbalized understanding. Pt discharged to home via ambulation, declined wheelchair transport. Pt states that they have no further requests at this time.

## 2021-03-15 NOTE — Progress Notes
Pharmaceutical Services - Meds to [facility] Discharge Medication Reconciliation and Counseling Note    Patient Name: Russell Wiggins  Medical Record Number: [id number]  Admit date: 03/14/2021 10:07 AM    Age: 46 y.o.  Sex: male  Allergies: Patient has no known allergies.    Preferred Pharmacy:   [facility], [place] - [address]  [address]  [place] [address]         I reconciled the discharge medications and counseled the patient on all new prescriptions. Discharge prescriptions were delivered to bedside. The purpose, potential side effects, storage, missed doses and any special instructions related to each medication was discussed. Medications continued from home were also reviewed with the patient.    Discharge Medication List from AVS:     Changes To My Medications      START taking these medications      Dose Instructions   acetaminophen 325 mg tablet  Commonly known as: Tylenol   650 mg   Take 2 tablets (650 mg total) by mouth every six (6) hours as needed for Pain.     oxyCODONE 5 mg tablet   5 mg   Take 1 tablet (5 mg total) by mouth every six (6) hours as needed for Severe Pain (Pain Scale 7-10) (For pain not controlled by tylenol). Max Daily Amount: 20 mg           Prescriptions      These medications were sent to [facility] [phone])  [address], [place] [address]    Hours: Mon-Fri 8AM-9PM, Sat 8AM-7PM, Sun/Holidays 8AM-5PM (Closed 1PM-2PM for lunch) Phone: [phone]    acetaminophen 325 mg tablet   oxyCODONE 5 mg tablet       Pain regimen reviewed in detail with the patient. Patient acknowledged understanding and is aware of the adverse effects of oxycodone.      Sabas Sous, PharmD, 03/15/2021, 3:41 PM

## 2021-03-15 NOTE — Discharge Instructions
*** SURGERY HOME CARE INSTRUCTIONS    For questions or concerns: Monday - Friday, 8 a.m. to 5 p.m., call [phone] [facility]).  After 5 p.m. weekdays, any weekend or holiday, call [phone] [facility] Page Operator and ask for the Surgery Resident on call.    Operation: Procedure(s): Laparoscopic removal of gastric band    Please review the following discharge instructions. They will answer many common questions people have after surgery.    WHEN YOU CAN RESUME YOUR USUAL ACTIVITIES  Resume regular activity gradually as tolerated. Walking, using stairs, riding in a car, are all acceptable activities. For six weeks from the day of surgery, avoid activities that cause a lot of abdominal wall motion such as running, jogging, swimming, or exercising.     WALKING  You may walk as much as you can. Walking will improve circulation, increase your feeling of well-being, and prevent pulmonary problems. Walking will also help reduce gas pain.     LIFTING  NO HEAVY LIFTING! Do not lift more than 5 lbs for 6 weeks from the day of surgery.    DRIVING  You may resume driving when you are no longer using any narcotic pain medication, and are comfortable moving about.    WOUND CARE   Remove the Tegaderm 2 days after surgery. Keep steristrips (they usually fall off on their own in 5-7 days). You may start showering after you remove the Tegaderm.   Shower only and gently dry the incisions after bathing. For 4 weeks, avoid submerging in water, bathing or swimming. Showers only are okay.    BATHING/ INCISIONAL CARE  Some pain is expected, but it should be mild and continually improving. The incision will become firm but should soften over several weeks.     Laparoscopic incision sites require no additional wound care. Shower only and gently dry the incisions after bathing. You may shower 48 hrs after your surgery. If you go home the next day after surgery, you still have the original operating room dressing in place. Please, remove the top dressing 2 days after surgery. Underneath the top dressing you may have Steristrips covering your incisions. Leave the Steri-strips in place. The steri-strips will fall off on their own in 7-10 days. You may shower without covering your incisions.    No bathtubs, swimming, hot tubs or immersion in water for 5 weeks.    MEDICATIONS  Pain  It is recommended that in the first few days after surgery that you take the medication on a schedule to avoid getting behind in your pain control. After a few days you can begin spreading out or tapering your medication and taking it as needed.   Tylenol 650 mg every 6 hours - for baseline pain control; do not exceed 3,000 mg in 24 hours.  Oxycodone 06/18/13 mg every 6 hours - for severe pain  Percocet, Vicodin and Norco - contain Tylenol,  Do NOT take over-the-counter products that contain Tylenol  Do not take aspirin or ibuprofen for 3 days after your surgery.  Home medications  Resume taking your previous medications unless otherwise directed  Please modify the medications you were taking before surgery as follows:        Medication List        START taking these medications      acetaminophen 325 mg tablet  Commonly known as: Tylenol  Take 2 tablets (650 mg total) by mouth every six (6) hours as needed for Pain.  oxyCODONE 5 mg tablet  Take 1 tablet (5 mg total) by mouth every six (6) hours as needed for Severe Pain (Pain Scale 7-10) (For pain not controlled by tylenol). Max Daily Amount: 20 mg               Where to Get Your Medications        These medications were sent to [facility] [phone])  [address], [place] [address]      Hours: Mon-Fri 8AM-9PM, Sat 8AM-7PM, Sun/Holidays 8AM-5PM (Closed 1PM-2PM for lunch) Phone: [phone]   acetaminophen 325 mg tablet  oxyCODONE 5 mg tablet         DIET     Clear liquid are required for the first 3 weeks after surgery and supplemented with protein shakes  This diet will began while you are in the hospital. Food Allowed:  Apple Juice  Cranberry Juice  White Grape Juice  Broth or consomm?  Jell-0  Decaf Coffee (without milk)  [place] Ice/Popsicles       WORK  Most patients return to work/school within 1 week.    DANGER SIGNALS TO WATCH FOR AT HOME   In case of emergency, report to your closest emergency room or call 911   Chills, fever exceeding 101 ?F (38.5 ?C)?   Nausea or vomiting persisting for 24 hours  Redness, Swelling, or foul-smelling drainage from incision  Discomfort not relieved by prescribed medications  Sudden, persistent increase in pain  Inability to pass urine  Constipation or excessive diarrhea which persists beyond two days    HOW TO REACH [place]  We are available to you at all times. Call the [facility] Page Operator at [phone] and request the operator to page the on-call surgery resident.    AFTER SURGERY FOLLOW UP  Follow up with your surgeon as directed within 1-2 weeks. To set up an appointment, please call the appropriate number below:  [facility]: [phone]  [facility]: [phone]   Follow-up with your primary care physician within 2-3 weeks.     [facility]  [address]  [place], [place] [address]     [facility]  [address]  [place], [place] [address]

## 2021-03-17 LAB — Tissue Exam

## 2021-03-20 ENCOUNTER — Ambulatory Visit: Payer: BLUE CROSS/BLUE SHIELD

## 2021-03-27 ENCOUNTER — Telehealth: Payer: BLUE CROSS/BLUE SHIELD

## 2021-03-27 NOTE — Progress Notes
Russell Wiggins comes back to the [facility] today for a postoperative visit 2 weeks after lap band removal. The patient has been doing well since surgery. The pain is largely resolved. The patient is eating well. The wound has always been clean and dry. The patient denies fever, chills, nausea or vomiting.   PE:   There were no vitals taken for this visit.  ABD: Soft, NT, ND, wound C/D/I.   A/P:   Cameran Pettey is doing well since surgery. Follow up in 6 months. If he wants another bariatric procedure, gastric bypass might be a better option because of the thick scar tissue at GE junction.

## 2021-04-02 DIAGNOSIS — Z9884 Bariatric surgery status: Secondary | ICD-10-CM

## 2021-04-15 ENCOUNTER — Non-Acute Institutional Stay: Payer: BLUE CROSS/BLUE SHIELD

## 2021-04-25 ENCOUNTER — Non-Acute Institutional Stay: Payer: BLUE CROSS/BLUE SHIELD

## 2021-04-30 ENCOUNTER — Ambulatory Visit: Payer: BLUE CROSS/BLUE SHIELD

## 2021-04-30 DIAGNOSIS — K219 Gastro-esophageal reflux disease without esophagitis: Secondary | ICD-10-CM

## 2021-04-30 NOTE — Progress Notes
Name: Russell Wiggins  Medical Record Number: [id number]  Date of Service: 04/30/2021    Chief Complaint:   Chief Complaint   Patient presents with    Follow-up     Recent Lap band removal/ pt stated after pt eats he gets acid reflux and pain around stomach.        History of Present Illness: Russell Wiggins is a 46 y.o. male who presents with with indigestion, gerd like reflux for several weeks after he had lap band removed.        Review of Systems:   Gen - no acute distress  CV - no chest pain, no palpitations  Resp - no shortness of breath, no dyspnea  GI - + abdominal pain, diarrhea, constipation  Skin - no rash    Physical Exam:   Vitals:   Last Recorded Vital Signs:    04/30/21 1600   BP: 105/68   Pulse: (!) 100   Temp: 36.6 C (97.9 F)        General: No acute distress,  HEENT: Normocephalic, atraumatic, PERRL, conjunctiva/corneas clear, EOM's intact bilaterally  Abdomen: Soft, non-tender, no masses, no palpable organomegaly  Neurologic: No focal neurologic deficits    Assessment and Plan:   The patient is a 46 y.o. year old male with:    Diagnoses and all orders for this visit:    Gastroesophageal reflux disease without esophagitis  -     **[place] [facility] REFERRAL TO GASTROENTEROLOGY    -will do PPI trial   Marcelene Butte, MD  [facility]  [facility]

## 2021-05-24 ENCOUNTER — Emergency Department (HOSPITAL_BASED_OUTPATIENT_CLINIC_OR_DEPARTMENT_OTHER)
Admission: EM | Admit: 2021-05-24 | Discharge: 2021-05-24 | Disposition: A | Discharge status: Home / Self Care | Payer: No Typology Code available for payment source | Attending: Student | Admitting: Student

## 2021-05-24 ENCOUNTER — Encounter (HOSPITAL_BASED_OUTPATIENT_CLINIC_OR_DEPARTMENT_OTHER): Payer: Self-pay | Admitting: *Deleted

## 2021-05-24 ENCOUNTER — Other Ambulatory Visit: Payer: Self-pay

## 2021-05-24 DIAGNOSIS — I889 Nonspecific lymphadenitis, unspecified: Secondary | ICD-10-CM | POA: Diagnosis not present

## 2021-05-24 DIAGNOSIS — K0889 Other specified disorders of teeth and supporting structures: Secondary | ICD-10-CM | POA: Diagnosis present

## 2021-05-24 DIAGNOSIS — Z87891 Personal history of nicotine dependence: Secondary | ICD-10-CM | POA: Diagnosis not present

## 2021-05-24 MED ORDER — NAPROXEN 250 MG PO TABS
375.0000 mg | ORAL_TABLET | Freq: Once | ORAL | Status: AC
  Administered 2021-05-24: 375 mg via ORAL
  Filled 2021-05-24: qty 2

## 2021-05-24 MED ORDER — DEXAMETHASONE 4 MG PO TABS
8.0000 mg | ORAL_TABLET | Freq: Once | ORAL | Status: AC
  Administered 2021-05-24: 8 mg via ORAL
  Filled 2021-05-24: qty 2

## 2021-05-24 MED ORDER — DEXAMETHASONE 1 MG/ML PO CONC: 8.0000 mg | Freq: Once | ORAL | Status: DC

## 2021-05-24 MED ORDER — AMOXICILLIN-POT CLAVULANATE 875-125 MG PO TABS: 1.0000 | ORAL_TABLET | Freq: Two times a day (BID) | ORAL | 0 refills | Status: AC

## 2021-05-24 MED ORDER — NAPROXEN 375 MG PO TABS: 375.0000 mg | ORAL_TABLET | Freq: Two times a day (BID) | ORAL | 0 refills | Status: AC

## 2021-05-24 MED ORDER — AMOXICILLIN-POT CLAVULANATE 875-125 MG PO TABS
1.0000 | ORAL_TABLET | Freq: Once | ORAL | Status: AC
  Administered 2021-05-24: 1 via ORAL
  Filled 2021-05-24: qty 1

## 2021-05-24 NOTE — ED Triage Notes (Signed)
Patient reports waking up this am with right lower jaw pain and swelling. Unsure of any dental carries causing pain. Hx of smoking, stopped and does vape now. No fevers. Has dentist appt to fix a cracked front tooth next week.  ?

## 2021-05-24 NOTE — ED Provider Notes (Signed)
?[facility] EMERGENCY DEPT ?Provider Note ? ?CSN: [id number] ?Arrival date & time: 05/24/21 1610 ? ?Chief Complaint(s) ?Dental Pain ? ?HPI ?Eddie Rodriguez is a 46 y.o. male who presents emergency department for evaluation of left-sided facial swelling and dental pain.  Patient states that he is currently undergoing extensive dental work and last night felt tooth #8 crack.  He states that he awoke with swelling to the right mandible with minimal tenderness but denies dysphagia, trismus, neck pain or other systemic symptoms.  Arrives with mild swelling to the angle of the mandible on the right. ? ? ?Dental Pain ?Associated symptoms: facial swelling   ? ?Past Medical History ?Past Medical History:  ?Diagnosis Date  ? PTSD (post-traumatic stress disorder)   ? ?There are no problems to display for this patient. ? ?Home Medication(s) ?Prior to Admission medications   ?Medication Sig Start Date End Date Taking? Authorizing Provider  ?amoxicillin-clavulanate (AUGMENTIN) 875-125 MG tablet Take 1 tablet by mouth every 12 (twelve) hours. 05/24/21  Yes Adelaide Pfefferkorn, MD  ?naproxen (NAPROSYN) 375 MG tablet Take 1 tablet (375 mg total) by mouth 2 (two) times daily. 05/24/21  Yes Mayzee Reichenbach, MD  ?diphenhydrAMINE (BENADRYL) 25 mg capsule Take 25 mg by mouth at bedtime as needed.    [provider]  ?HYDROcodone-acetaminophen (NORCO/VICODIN) 5-325 MG tablet Take 1 tablet by mouth every 4 (four) hours as needed. ?Patient not taking: Reported on 10/10/2020 12/26/14   Leo Grosser, MD  ?MELATONIN GUMMIES PO Take 1 tablet by mouth at bedtime.    [provider]  ?                                                                                                                                  ?Past Surgical History ?Past Surgical History:  ?Procedure Laterality Date  ? EYE SURGERY    ? HYDROCELE EXCISION / REPAIR    ? ?Family History ?History reviewed. No pertinent family history. ? ?Social  History ?Social History  ? ?Tobacco Use  ? Smoking status: Former  ?  Packs/day: 0.50  ?  Types: Cigarettes  ? Smokeless tobacco: Never  ?Vaping Use  ? Vaping Use: Every day  ?Substance Use Topics  ? Alcohol use: No  ? Drug use: Yes  ?  Types: Marijuana  ? ?Allergies ?Patient has no known allergies. ? ?Review of Systems ?Review of Systems  ?HENT:  Positive for dental problem and facial swelling.   ? ?Physical Exam ?Vital Signs  ?I have reviewed the triage vital signs ?BP 113/79 (BP Location: Right Arm)   Pulse 72   Temp 98.5 ?F (36.9 ?C) (Oral)   Resp 16   SpO2 98%  ? ?Physical Exam ?Vitals and nursing note reviewed.  ?Constitutional:   ?   General: He is not in acute distress. ?   Appearance: He is well-developed.  ?HENT:  ?   Head: Normocephalic and atraumatic.  ?  Comments: Mild right-sided mandibular swelling and neck swelling ?Eyes:  ?   Conjunctiva/sclera: Conjunctivae normal.  ?Cardiovascular:  ?   Rate and Rhythm: Normal rate and regular rhythm.  ?   Heart sounds: No murmur heard. ?Pulmonary:  ?   Effort: Pulmonary effort is normal. No respiratory distress.  ?   Breath sounds: Normal breath sounds.  ?Abdominal:  ?   Palpations: Abdomen is soft.  ?   Tenderness: There is no abdominal tenderness.  ?Musculoskeletal:     ?   General: No swelling.  ?   Cervical back: Neck supple.  ?Skin: ?   General: Skin is warm and dry.  ?   Capillary Refill: Capillary refill takes less than 2 seconds.  ?Neurological:  ?   Mental Status: He is alert.  ?Psychiatric:     ?   Mood and Affect: Mood normal.  ? ? ?ED Results and Treatments ?Labs ?(all labs ordered are listed, but only abnormal results are displayed) ?Labs Reviewed - No data to display                                                                                                                       ? ?Radiology ?No results found. ? ?Pertinent labs & imaging results that were available during my care of the patient were reviewed by me and considered in my  medical decision making (see MDM for details). ? ?Medications Ordered in ED ?Medications  ?amoxicillin-clavulanate (AUGMENTIN) 875-125 MG per tablet 1 tablet (1 tablet Oral Given 05/24/21 1713)  ?naproxen (NAPROSYN) tablet 375 mg (375 mg Oral Given 05/24/21 1714)  ?dexamethasone (DECADRON) tablet 8 mg (8 mg Oral Given 05/24/21 1713)  ?                                                               ?                                                                    ?Procedures ?Procedures ? ?(including critical care time) ? ?Medical Decision Making / ED Course ? ? ?This patient presents to the ED for concern of facial swelling, this involves an extensive number of treatment options, and is a complaint that carries with it a high risk of complications and morbidity.  The differential diagnosis includes lymphadenitis, lymphadenopathy, submandibular abscess, RPA ? ?MDM: ?Patient seen emergency room for evaluation of facial and neck swelling.  Physical exam with mild swelling to the angle of the mandible on the right and into the neck.  No submental swelling.  No evidence of Ludwig's angina.  No obvious dental caries in that region.  Small chip to tooth #8.  We do not have ultrasound available at [facility] today and thus we will treat for presumed lymphadenitis likely secondary to a dental infection.  He is able to range his neck fully and I have low suspicion for RPA.  He was given single dose of Decadron, Naprosyn and Augmentin here in the emergency department and will be discharged with a prescription for Naprosyn and Augmentin.  He was given strict return precautions which include worsening symptoms, difficulty moving the neck, nausea, vomiting, Fever of which he voiced understanding.  Patient will follow-up with his dentist next week. ? ? ?Additional history obtained: ? ?-External records from outside source obtained and reviewed including: Chart review including previous notes, labs, imaging, consultation  notes ? ? ?Lab Tests: ?-I ordered, reviewed, and interpreted labs.   ?The pertinent results include:   ?Labs Reviewed - No data to display  ? ? ? ?Medicines ordered and prescription drug management: ?Meds ordered this encounter  ?Medications  ? DISCONTD: dexamethasone (DECADRON) 1 MG/ML solution 8 mg  ? amoxicillin-clavulanate (AUGMENTIN) 875-125 MG per tablet 1 tablet  ? naproxen (NAPROSYN) tablet 375 mg  ? dexamethasone (DECADRON) tablet 8 mg  ? amoxicillin-clavulanate (AUGMENTIN) 875-125 MG tablet  ?  Sig: Take 1 tablet by mouth every 12 (twelve) hours.  ?  Dispense:  14 tablet  ?  Refill:  0  ? naproxen (NAPROSYN) 375 MG tablet  ?  Sig: Take 1 tablet (375 mg total) by mouth 2 (two) times daily.  ?  Dispense:  20 tablet  ?  Refill:  0  ?  ?-I have reviewed the patients home medicines and have made adjustments as needed ? ?Critical interventions ?none ? ? ? ?Cardiac Monitoring: ?The patient was maintained on a cardiac monitor.  I personally viewed and interpreted the cardiac monitored which showed an underlying rhythm of: NSR ? ?Social Determinants of Health:  ?Factors impacting patients care include: none ? ? ?Reevaluation: ?After the interventions noted above, I reevaluated the patient and found that they have :improved ? ?Co morbidities that complicate the patient evaluation ? ?Past Medical History:  ?Diagnosis Date  ? PTSD (post-traumatic stress disorder)   ?  ? ? ?Dispostion: ?I considered admission for this patient, but patient does not meet inpatient criteria and is safe for discharge with outpatient follow-up.  He was given strict return precautions of which she voiced understanding and we will trial a course of antibiotics and anti-inflammatories.  Suspect lymphadenitis. ? ? ? ? ?Final Clinical Impression(s) / ED Diagnoses ?Final diagnoses:  ?Lymphadenitis  ? ? ? ?@PCDICTATION @ ? ?  ?Teressa Lower, MD ?05/24/21 1730 ? ?

## 2021-06-20 ENCOUNTER — Ambulatory Visit: Payer: BLUE CROSS/BLUE SHIELD

## 2021-07-15 ENCOUNTER — Telehealth: Payer: BLUE CROSS/BLUE SHIELD

## 2021-07-15 NOTE — Progress Notes
Patient Consent to Telehealth Questionnaire       05/02/2018     2:18 PM   [facility] TELEHEALTH PRECHECKIN QUESTIONS   By clicking ''I Agree'', I consent to the below:  I Agree     - I agree  to be treated via a video visit and acknowledge that I may be liable for any relevant copays or coinsurance depending on my insurance plan.  - I understand that this video visit is offered for my convenience and I am able to cancel and reschedule for an in-person appointment if I desire.  - I also acknowledge that sensitive medical information may be discussed during this video visit appointment and that it is my responsibility to locate myself in a location that ensures privacy to my own level of comfort.  - I also acknowledge that I should not be participating in a video visit in a way that could cause danger to myself or to those around me (such as driving or walking).  If my provider is concerned about my safety, I understand that they have the right to terminate the visit.     PATIENT: Russell Wiggins   MRN: [id number]   DOB: March 09, 1975   DATE OF SERVICE: 07/15/2021   PHONE: [phone]     PRIMARY CARE PROVIDER: Marcelene Butte, MD  Subjective:       History:  Jaishon Krisher is a 46 y.o.    European  White male who presents for a televideo encounter setup due to COVID-19 restrictions for referral request for endo for possible ozempic Rx for weight loss.    Objective:     General appearance: alert, well appearing, and in no distress.     Assessment/Plan:   Diagnoses and all orders for this visit:    Obesity, morbid, BMI 40.0-49.9 (HCC/RAF)  -     **[place] [facility] AM[facility]ULATORY REFERRAL TO ENDOCRINOLOGY        Followup: PRN  Instructed to call or be seen again should symptoms worsen or fail to improve.     The above diagnosis, orders, and follow-up were discussed with the patient. The patient had all questions answered satisfactorily and understands this recommended plan of care.      Author:  Marcelene Butte 07/15/2021 9:26 AM

## 2021-07-21 ENCOUNTER — Ambulatory Visit: Payer: BLUE CROSS/BLUE SHIELD

## 2021-09-09 ENCOUNTER — Ambulatory Visit: Payer: BLUE CROSS/BLUE SHIELD

## 2021-10-01 ENCOUNTER — Ambulatory Visit: Payer: BLUE CROSS/BLUE SHIELD

## 2021-10-01 DIAGNOSIS — R635 Abnormal weight gain: Secondary | ICD-10-CM

## 2021-10-01 NOTE — Patient Instructions
[facility]  [facility] [facility]  This is an intensive dietary and lifestyle weight loss program involving a multidisciplinary team of doctors, dieticians and therapists.  This clinic offers a range of dietary plsn from very-low calorie diets with meal replacement supplements to food-based low calorie diets.  If you are interested in learning more, please call  [phone] or visit their website [email or web address]  *Note: this program is not covered by insurance*    [facility]  Eight-week webinar series held Tuesdays from 3pm to 4pm. The cost is $80 for all eight lectures, and you can sign up anytime as the classes will be held every 8 weeks. Classes are led by [facility] physicians and dietitians.    To register or for more information:  Email: [email or web address]  Phone: [phone]   [email or web address]

## 2021-10-01 NOTE — Consults
[facility] New Patient Note    This 46 y.o.  year old patient enters with a chief complaint of abnormal weight gain and wishes to enter the [facility] for multidisciplinary weight management and continuing care. The patient has a past medical history of   Past Medical History:   Diagnosis Date   ? Empyema (HCC/RAF)    ? Obesity    .  Medical history: + Retinopathy, GERD, hx of lap band now removed , s/p cholecystectomy   Medications: none     Patient states that he has been struggling with weight for > 20 years.   15 years ago had gastric band with 70 pound weight loss. However had compliacaitons which led to having to ''release'' gastric band. Subsequently regained weight.   In the past patient has been able to successfully lose weight with lifestyle changes. Lost 70 pounds in order to have hip replacement surgery however then regained.   Had upper endoscopy with Dr. Imogene Burn during which lap band was removed in February. Since this time has gained 25 pounds. Bariatric surgery was recommended for further weight loss. However patient would like to avoid.     Lives with wife and kids.     Of note will be going to [place] next months for work. Concerned about compliance to diet during this time      Weight history:  - Highest weight adult current  - Weight loss medications none   - Weight loss programs RD in the past, weight watchers   - Weight loss goal- most places would not insure him for life insurance because of his weight, thus goal is to easily obtain insurance     Exercise No       Family hx of obesity: Yes  - father, paternal grandmother, paternal uncle    Body mass index is 52.83 kg/m?Marland Kitchen  Wt Readings from Last 10 Encounters:   10/01/21 (!) 146.2 kg (322 lb 6.4 oz)   03/14/21 (!) 136.4 kg (300 lb 11.3 oz)   10/29/20 (!) 136.1 kg (300 lb)   10/10/20 (!) 136.1 kg (300 lb)   09/26/20 (!) 135.6 kg (298 lb 15.1 oz)   09/02/20 (!) 135.6 kg (299 lb)   05/07/20 (!) 135.4 kg (298 lb 9.6 oz)   02/14/20 (!) 133.4 kg (294 lb)   10/03/19 (!) 134 kg (295 lb 6.4 oz)   02/24/18 (!) 130.2 kg (287 lb)       PAST MEDICAL HISTORY:  Past Medical History:   Diagnosis Date   ? Empyema (HCC/RAF)    ? Obesity        PAST SURGICAL HISTORY:  Past Surgical History:   Procedure Laterality Date   ? HIP FRACTURE SURGERY Right    ? LAPAROSCOPIC GASTRIC BANDING     ? LUNG SURGERY          CURRENT MEDICATIONS:  No outpatient medications have been marked as taking for the 10/01/21 encounter (Office Visit) with Everlean Alstrom., MD.        ALLERGIES:  No Known Allergies    FAMILY HISTORY:  Family History   Problem Relation Age of Onset   ? Lymphoma Father    ? Obesity Father    ? Diabetes type II Maternal Grandfather    ? Parkinsonism Paternal Grandfather    ? Osteoporosis Paternal Aunt    ? Obesity Paternal Uncle    ? Anesthesia problems Neg Hx    ?  Malignant hypertension Neg Hx    ? Malignant hyperthermia Neg Hx    ? Hypotension Neg Hx    ? Pseudochol deficiency Neg Hx         SOCIAL HISTORY:  Social History     Socioeconomic History   ? Marital status: Married   Tobacco Use   ? Smoking status: Never   ? Smokeless tobacco: Never   Substance and Sexual Activity   ? Alcohol use: Yes     Comment: one drink/week   ? Drug use: No   ? Sexual activity: Yes     Partners: Female   Other Topics Concern   ? Do you exercise at least a day, 3 or more days a week? No          Review of Systems:     ROS negative except as mentioned above        Physical Examination:   Vitals:   Last Recorded Vital Signs:    10/01/21 1024   BP: 132/95   Pulse: 89     Wt Readings from Last 10 Encounters:   10/01/21 (!) 146.2 kg (322 lb 6.4 oz)   03/14/21 (!) 136.4 kg (300 lb 11.3 oz)   10/29/20 (!) 136.1 kg (300 lb)   10/10/20 (!) 136.1 kg (300 lb)   09/26/20 (!) 135.6 kg (298 lb 15.1 oz)   09/02/20 (!) 135.6 kg (299 lb)   05/07/20 (!) 135.4 kg (298 lb 9.6 oz)   02/14/20 (!) 133.4 kg (294 lb)   10/03/19 (!) 134 kg (295 lb 6.4 oz) 02/24/18 (!) 130.2 kg (287 lb)       General: In no acute distress.  Skin: Warm. No visible rashes/ecchymoses.   Head: NC/AT  HEENT:  Extraocular muscles intact. No discharge. Not icteric. Conjunctiva within normal limits.   GI: Obese,  soft, bowel sounds present, nontender.   Musculoskeletal: Atraumatic, full range of motion of four extremities. Gait within normal limits.  Neurological: Alert, oriented.  Moves all extremities spontaneously.    Psychological: Normal mood and affect.     ECG reviewed:  BIA:      10/01/2021    10:24 AM   Body Composition RFO   Weight: Bio Impedance Analysis 323 lbs   BMR 2665   Body Fat % 41.9   Fat Mass 135.34   FFM 187.66   TBW (total body water) 137 lbs 6 oz         Impression: This is a 46 y.o.-year-old male who does have a family history of obesity and has been overweight/obese for > 20 years.  The patient has participated in previous diet and exercise programs without success in long-term weight management. Currently the patient has the following  co-morbid conditions   Patient Active Problem List   Diagnosis   ? Central serous chorioretinopathy of left eye   ? History of laparoscopic adjustable gastric banding   ? Obesity   .    The current Body mass index is 52.83 kg/m?Marland Kitchen.    Plan:     I discussed with the patient in detail the following:    1.  Amount of protein needed daily to control hunger and maintain muscle mass based on lean body mass. Goal ~1 g/lb FFM     2. The importance of monitoring potassium and other blood tests routinely at the [facility] R[facility] Management program.  -- KCl supplement prescribed: No     3.  The patient was told  to undertake an exercise regimen only after being in the program for at least two weeks due to expected fluid shifts that can occur in the initial two weeks of weight loss.     4.   I emphasized the need to attend clinic regularly and to attend the behavior and nutrition education sessions.     5.  I have also discussed the patients desired diet plan with is MVLCD and transition plan to maintenance and healthy diet and lifestyle.     6. Patient is to make the following medication modifications: none    7. Side effects of weight loss with VLCD/MVLCD/LCD were discussed with patient, including but not limited to hair loss, gallstones, low potassium levels, fatigue, constipation, and lightheadedness.    The patient understands both the rate of weight loss and the time needed to reach their personal target weight.  The patient will be given a personalized Food Planning List indicating number of shakes per day and any food choices by the [facility] dietitian.     Author: Francies Inch A. Lance Coon, MD

## 2021-10-01 NOTE — Consults
[place] [facility]  NEW PATIENT NOTE    DATE OF SERVICE: 10/01/2021    ANTHROPOMETRICS  (!) 322 lb 6.4 oz (146.2 kg)  5' 5.5'' (1.664 m)    BMI:   Body mass index is 52.83 kg/m.     Weight in (lb) to have BMI = 25: 152.2  Ideal body weight: 62.7 kg (138 lb 1.9 oz)  Adjusted ideal body weight: 96.1 kg (211 lb 13.3 oz)    SUBJECTIVE/WT HX: pt is excited about the program      START WT 10/01/2021: (!) 322 lb 6.4 oz (146.2 kg)  BIA INFORMATION      10/01/2021    10:24 AM   Body Composition RFO   Weight: Bio Impedance Analysis 323 lbs   BMR 2665   Body Fat % 41.9   Fat Mass 135.34   FFM 187.66   TBW (total body water) 137 lbs 6 oz     WEIGHT GOAL: 220#  PAST MED HX: Empyema, obesity  DIET RESTRICTIONS: none    CURRENT PHYSICAL ACTIVITY: walking 3 miles/week, 3 days/week  BARRIERS TO PHYSICAL ACTIVITY: none    24-HOUR RECALL OF FOOD/BEVERAGE INTAKE:  BREAKFAST:  LUNCH:  DINNER:  SNACKS:  BEVERAGES:  ESTIMATED CALORIE INTAKE:    GOAL/RECOMMEDATIONS:  1. Start MVLCD Program   2. Rec to reach 2-3 L water   3. Hold off on activity until adjusted to diet (2 weeks)  4. Discussed rare side effects associated w/ MVLCD     Nutrition RX:  PLAN: MVLCD 3 zpros, 1 Nutrimed, 1 soup, 1 chip, 1 bar+ 1 meal: 2 prot, 2 veg, 1 fruit  TOTAL PROTEIN: 185g  TOTAL CALORIES: ~1,400                    Cristel Rail MS, RDN

## 2021-10-07 ENCOUNTER — Ambulatory Visit: Payer: BLUE CROSS/BLUE SHIELD

## 2021-10-07 DIAGNOSIS — R109 Unspecified abdominal pain: Secondary | ICD-10-CM

## 2021-10-07 NOTE — Progress Notes
Name: Russell Wiggins  Medical Record Number: [id number]  Date of Service: 10/07/2021    Chief Complaint:   Chief Complaint   Patient presents with    Back Pain       History of Present Illness: Russell Wiggins is a 46 y.o. male who presents with pain inside his abdomen towards his back, in the area where he had his lap band removed, thinks it might be related to that. Already has appt to f/u next week with bariatric surgery, no nausea, no vomiting.        Review of Systems:   Gen - no acute distress  CV - no chest pain, no palpitations  Resp - no shortness of breath, no dyspnea  GI - + abdominal pain, diarrhea, constipation  Skin - no rash    Physical Exam:   Vitals:   Last Recorded Vital Signs:    10/07/21 1404   BP: 138/80   Pulse: 93   Resp: 19   Temp: 36.4 C (97.6 F)   SpO2: 95%        General: No acute distress,  HEENT: Normocephalic, atraumatic, PERRL, conjunctiva/corneas clear, EOM's intact bilaterally  Neurologic: No focal neurologic deficits    Assessment and Plan:   The patient is a 46 y.o. year old male with:    Diagnoses and all orders for this visit:    Abdominal discomfort    -possible scar tissue from previous abdominal procedures, f/u with bariatric surgery.   Marcelene Butte, MD  [facility]  [facility]

## 2021-10-08 ENCOUNTER — Ambulatory Visit: Payer: BLUE CROSS/BLUE SHIELD

## 2021-10-08 ENCOUNTER — Ambulatory Visit: Payer: BLUE CROSS/BLUE SHIELD | Attending: Student in an Organized Health Care Education/Training Program

## 2021-10-08 NOTE — Progress Notes
[facility] Follow-Up Note    Diet Plan: MVLCD    Subjective:  Patient has been doing well,  is losing weight. Weight change since last visit: 4 pounds      Exercise - none currently    Diet  Had a lot of gas with protein shakes, bought premier protein shakes and using those instead. Was using bars instead of soup but ran out of bars and bought protein bars  - Patient is not consuming recommended amount of product, substituting other protein products, finds it hard to reach 185 grams protein a day  - Amount of water 64 oz  -   PLAN: MVLCD 3 zpros, 1 Nutrimed, 1 soup, 1 chip, 1 bar+ 1 meal: 2 prot, 2 veg, 1 fruit  TOTAL PROTEIN: 185g  TOTAL CALORIES: ~1,400    Bowel habits: normal      Weight in (lb) to have BMI = 25: 152.2  Ideal body weight: 62.7 kg (138 lb 1.9 oz)  Adjusted ideal body weight: 95.4 kg (210 lb 6.3 oz)  Weight history:     Body mass index is 52.24 kg/m?Marland Kitchen  Wt Readings from Last 10 Encounters:   10/08/21 (!) 318 lb 12.8 oz (144.6 kg)   10/07/21 (!) 319 lb 9.6 oz (145 kg)   10/01/21 (!) 322 lb 6.4 oz (146.2 kg)   03/14/21 (!) 300 lb 11.3 oz (136.4 kg)   10/29/20 (!) 300 lb (136.1 kg)   10/10/20 (!) 300 lb (136.1 kg)   09/26/20 (!) 298 lb 15.1 oz (135.6 kg)   09/02/20 (!) 299 lb (135.6 kg)   05/07/20 (!) 298 lb 9.6 oz (135.4 kg)   02/14/20 (!) 294 lb (133.4 kg)       PMH:  Past Medical History:   Diagnosis Date   ? Empyema (HCC/RAF)    ? Obesity         CURRENT MEDICATIONS:  No outpatient medications have been marked as taking for the 10/08/21 encounter (Office Visit) with [place], Kalliope Riesen J., MD.          Objective:  Vitals:   Last Recorded Vital Signs:    10/08/21 1052   BP: 139/95   Pulse: 95       Lab Results   Component Value Date    WBC 8.1 10/01/2021    HGB 16.0 10/01/2021    HCT 47.9 10/01/2021    PLT 233 10/01/2021    NEUTABS 4,649 10/01/2021    LYMPHABS 2,309 10/01/2021     Results for orders placed or performed in visit on 11/20/20   Lipid Panel   Result Value Ref Range Cholesterol 147 See Comment mg/dL    Cholesterol,LDL,Calc 94 <100 mg/dL    Cholesterol, HDL 34 (L) >40 mg/dL    Triglycerides 93 <213 mg/dL    Non-HDL,Chol,Calc 086 <130 mg/dL     Lab Results   Component Value Date    NA 140 10/01/2021    K 4.7 10/01/2021    CL 106 10/01/2021    GLUCOSE 84 10/01/2021    CREAT 1.03 10/01/2021    CALCIUM 9.5 10/01/2021    ALBUMIN 4.1 10/01/2021    AST 22 10/01/2021    ALT 36 10/01/2021    ALKPHOS 49 10/01/2021     Lab Results   Component Value Date    CHOL 147 10/01/2021    CHOL 147 11/20/2020    CHOLHDL 46 10/01/2021    CHOLHDL 34 (L) 11/20/2020    CHOLDLCAL  82 10/01/2021    CHOLDLCAL 94 11/20/2020    TRIGLY 99 10/01/2021    TRIGLY 93 11/20/2020       Labs, BIA, EKG reviewed  BIA:      10/01/2021    10:24 AM   Body Composition RFO   Weight: Bio Impedance Analysis 323 lbs   BMR 2665   Body Fat % 41.9   Fat Mass 135.34   FFM 187.66   TBW (total body water) 137 lbs 6 oz         Assessment:  Patient  is not adherent to diet.  The compliance with the program is  good        Gastrointestinal function : Normal     Plan  -Continue in the [facility] program with MVLCD     -Calorie level is 1400    - encouraged to aim for 125-150 grams of protein a day to start, then increase to 185 grams    -Activity: ok for light walking if can tolerate    -Medication changes: none    -Notable labs: 8/23 wnl    Author: Nautia Lem J. [place], MD

## 2021-10-09 ENCOUNTER — Ambulatory Visit: Payer: BLUE CROSS/BLUE SHIELD

## 2021-10-09 MED ORDER — OMEPRAZOLE 40 MG PO CPDR
40 mg | ORAL_CAPSULE | Freq: Every day | ORAL | 3 refills | Status: AC
Start: 2021-10-09 — End: ?

## 2021-10-09 NOTE — Progress Notes
BARIATRIC SURGERY FOLLOW-UP VISIT      CHIEF COMPLAINT:  Follow up on medical weight loss.      HISTORY OF PRESENT ILLNESS: The patient is a 46 y.o. male who was seen for monitoring of weight loss and screening for possible medical complications and nutritional deficiencies.    Measurements in clinic today are Ht:5' 5.5'' (1.664 m) Wt:(!) 319 lb 12.8 oz (145.1 kg) EAV:WUJW surface area is 2.59 meters squared. Body mass index is 52.41 kg/m?Marland Kitchen    He had lap band removal 6 months ago. He c/o some mild RLQ pain after eating large meals. He also c/o reflux.       CURRENT MEDICAL CONDITIONS:  Patient Active Problem List   Diagnosis   ? Central serous chorioretinopathy of left eye   ? History of laparoscopic adjustable gastric banding   ? Obesity   ? Retinopathy   ? GERD (gastroesophageal reflux disease)          MEDICATIONS:    Current Outpatient Medications:   ?  omeprazole 40 mg DR capsule, Take 1 capsule (40 mg total) by mouth daily., Disp: 60 capsule, Rfl: 3      PAST MEDICAL HISTORY:   Past Medical History:   Diagnosis Date   ? Empyema (HCC/RAF)    ? Obesity          PAST SURGICAL HISTORY:   Past Surgical History:   Procedure Laterality Date   ? HIP FRACTURE SURGERY Right    ? LAPAROSCOPIC GASTRIC BANDING     ? LUNG SURGERY           FAMILY HISTORY:   Family History   Problem Relation Age of Onset   ? Lymphoma Father    ? Obesity Father    ? Diabetes type II Maternal Grandfather    ? Parkinsonism Paternal Grandfather    ? Osteoporosis Paternal Aunt    ? Obesity Paternal Uncle    ? Anesthesia problems Neg Hx    ? Malignant hypertension Neg Hx    ? Malignant hyperthermia Neg Hx    ? Hypotension Neg Hx    ? Pseudochol deficiency Neg Hx          SOCIAL HISTORY:   Social History     Socioeconomic History   ? Marital status: Married   Tobacco Use   ? Smoking status: Never   ? Smokeless tobacco: Never   Substance and Sexual Activity   ? Alcohol use: Yes     Comment: one drink/week   ? Drug use: No   ? Sexual activity: Yes Partners: Female   Other Topics Concern   ? Do you exercise at least a day, 3 or more days a week? No         PHYSICAL EXAMINATION:   Ht:5' 5.5'' (1.664 m) Wt:(!) 319 lb 12.8 oz (145.1 kg) JXB:JYNW surface area is 2.59 meters squared. Body mass index is 52.41 kg/m?.     BP 117/78  ~ Pulse 96  ~ Temp 36.3 ?C (97.4 ?F) (Tympanic)  ~ Resp 17  ~ Ht 5' 5.5'' (1.664 m)  ~ Wt (!) 319 lb 12.8 oz (145.1 kg)  ~ SpO2 95%  ~ BMI 52.41 kg/m?     GENERAL: Well nourished, well groomed in no acute distress.  NEURO: Alert and oriented x3, mood and affect appropriate.  HEENT: Pupils equal. EOMs grossly normal.   LUNGS: Clear to auscultation.  CARDIO: Regular rate and rhythm.  SKIN: Smooth and dry.  ABDOMEN: Bowel sounds normal. Soft, non-tender, no masses. No hepatomegaly, no splenomegaly.  EXTREMITIES: Palpable pedal pulses. No venous stasis changes.    ASSESSMENT:  The patient is doing well preparing for surgery      PLAN:     I don't know the etiology of his abdominal pain. It is unlikely the pain is related to the lap band removal surgery. The pain went away after he started to protein shake diet. I advised him to keep watching.     I think he has GERD. PPI prescribed. I will do an EGD in the coming months. Our NP will order the EGD next week.            Camie Patience MD   Associate Professor  Section of Minimally Invasive and Bariatric Surgery  Division of General Surgery  [facility]  Pager [phone]

## 2021-10-15 ENCOUNTER — Ambulatory Visit: Payer: BLUE CROSS/BLUE SHIELD

## 2021-10-15 DIAGNOSIS — R635 Abnormal weight gain: Secondary | ICD-10-CM

## 2021-10-15 NOTE — Progress Notes
[facility] Follow-Up Note    Diet: MVLCD  START WT 10/01/2021: (!) 322 lb 6.4 oz (146.2 kg)  s/p lap band removal     Subjective:  Patient has been doing well,  is losing weight.     Patient feeling good overall. Plans to keep log of weights.     Exercise - considering 3 days per week doing strength training     Diet  - Patient is consuming recommended amount of product  - Amount of water > 2 liters    PLAN: MVLCD 3 zpros, 1 Nutrimed, 1 soup, 1 chip, 1 bar+ 1 meal: 2 prot, 2 veg, 1 fruit  TOTAL PROTEIN: 185g  TOTAL CALORIES: ~1,400  Weight in (lb) to have BMI = 25: 152.2  Ideal body weight: 62.7 kg (138 lb 1.9 oz)  Adjusted ideal body weight: 95 kg (209 lb 6.9 oz)  Weight history:     Body mass index is 51.85 kg/m?Marland Kitchen  Wt Readings from Last 10 Encounters:   10/15/21 (!) 143.5 kg (316 lb 6.4 oz)   10/09/21 (!) 145.1 kg (319 lb 12.8 oz)   10/08/21 (!) 144.6 kg (318 lb 12.8 oz)   10/07/21 (!) 145 kg (319 lb 9.6 oz)   10/01/21 (!) 146.2 kg (322 lb 6.4 oz)   03/14/21 (!) 136.4 kg (300 lb 11.3 oz)   10/29/20 (!) 136.1 kg (300 lb)   10/10/20 (!) 136.1 kg (300 lb)   09/26/20 (!) 135.6 kg (298 lb 15.1 oz)   09/02/20 (!) 135.6 kg (299 lb)       PMH:  Past Medical History:   Diagnosis Date   ? Empyema (HCC/RAF)    ? Obesity         CURRENT MEDICATIONS:  No outpatient medications have been marked as taking for the 10/15/21 encounter (Office Visit) with Everlean Alstrom., MD.          Objective:  Vitals:   Last Recorded Vital Signs:    10/15/21 0935   BP: 116/74       Lab Results   Component Value Date    WBC 8.1 10/01/2021    HGB 16.0 10/01/2021    HCT 47.9 10/01/2021    PLT 233 10/01/2021    NEUTABS 4,649 10/01/2021    LYMPHABS 2,309 10/01/2021     Results for orders placed or performed in visit on 11/20/20   Lipid Panel   Result Value Ref Range    Cholesterol 147 See Comment mg/dL    Cholesterol,LDL,Calc 94 <100 mg/dL    Cholesterol, HDL 34 (L) >40 mg/dL    Triglycerides 93 <161 mg/dL Non-HDL,Chol,Calc 096 <045 mg/dL     Lab Results   Component Value Date    NA 141 10/08/2021    K 4.6 10/08/2021    CL 104 10/08/2021    GLUCOSE 91 10/08/2021    CREAT 1.05 10/08/2021    CALCIUM 9.9 10/08/2021    ALBUMIN 4.1 10/01/2021    AST 22 10/01/2021    ALT 36 10/01/2021    ALKPHOS 49 10/01/2021     Lab Results   Component Value Date    CHOL 147 10/01/2021    CHOL 147 11/20/2020    CHOLHDL 46 10/01/2021    CHOLHDL 34 (L) 11/20/2020    CHOLDLCAL 82 10/01/2021    CHOLDLCAL 94 11/20/2020    TRIGLY 99 10/01/2021    TRIGLY 93 11/20/2020       Labs reviewed  BIA:  10/01/2021    10:24 AM   Body Composition RFO   Weight: Bio Impedance Analysis 323 lbs   BMR 2665   Body Fat % 41.9   Fat Mass 135.34   FFM 187.66   TBW (total body water) 137 lbs 6 oz         Assessment:  Patient  is adherent to diet.  The compliance with the program is  good          Plan  -Continue in the [facility] program with MVLCD     -Calorie level is 1400    -Activity: considering starting strength training 3 times per week    -Medication changes: none    -Notable labs: none     Author: Rylen Swindler A. Lance Coon, MD

## 2021-10-21 ENCOUNTER — Ambulatory Visit: Payer: BLUE CROSS/BLUE SHIELD

## 2021-10-21 DIAGNOSIS — Z9884 Bariatric surgery status: Secondary | ICD-10-CM

## 2021-10-21 DIAGNOSIS — R1084 Generalized abdominal pain: Secondary | ICD-10-CM

## 2021-10-22 ENCOUNTER — Ambulatory Visit: Payer: BLUE CROSS/BLUE SHIELD

## 2021-11-03 ENCOUNTER — Ambulatory Visit: Payer: BLUE CROSS/BLUE SHIELD

## 2021-11-04 ENCOUNTER — Ambulatory Visit: Payer: BLUE CROSS/BLUE SHIELD | Attending: Gastroenterology

## 2021-11-05 ENCOUNTER — Ambulatory Visit: Payer: BLUE CROSS/BLUE SHIELD | Attending: Registered"

## 2021-11-05 NOTE — Consults
[place] [facility]  NEW PATIENT NOTE    DATE OF SERVICE: 11/05/2021    ANTHROPOMETRICS  (!) 143.9 kg (317 lb 3.2 oz)  1.664 m (5' 5.5'')    BMI:   Body mass index is 51.98 kg/m.     Weight in (lb) to have BMI = 25: 152.2  Ideal body weight: 62.7 kg (138 lb 1.9 oz)  Adjusted ideal body weight: 95.1 kg (209 lb 12 oz)    SUBJECTIVE/WT HX: Pt just came back from out of the country, was traveling to [place]. Pt stated [place] lot in [place] but also walked a lot. Pt stated he will also begin strength training exercises as well as walking.       START WT 11/05/2021: (!) 143.9 kg (317 lb 3.2 oz)  BIA INFORMATION      10/01/2021    10:24 AM   Body Composition RFO   Weight: Bio Impedance Analysis 323 lbs   BMR 2665   Body Fat % 41.9   Fat Mass 135.34   FFM 187.66   TBW (total body water) 137 lbs 6 oz     WEIGHT GOAL: 220#  PAST MED HX: Empyema, obesity  DIET RESTRICTIONS: none    CURRENT PHYSICAL ACTIVITY: walking 3 miles/week, 3 days/week  BARRIERS TO PHYSICAL ACTIVITY: none    24-HOUR RECALL OF FOOD/BEVERAGE INTAKE:  BREAKFAST: protein shake   LUNCH: protein shake   Snack: protein bar  DINNER: chicken, vegetables   SNACKS: protein shake   BEVERAGES: 60 ooz water   ESTIMATED CALORIE INTAKE: 1400 kcal     GOAL/RECOMMEDATIONS:  1. Start MVLCD Program   2. Rec to reach 2-3 L water   3. Hold off on activity until adjusted to diet (2 weeks)  4. Discussed rare side effects associated w/ MVLCD     Nutrition RX:  PLAN: MVLCD 3 zpros, 1 Nutrimed, 1 soup, 1 chip, 1 bar+ 1 meal: 2 prot, 2 veg, 1 fruit  TOTAL PROTEIN: 185g  TOTAL CALORIES: ~1,400                    Gennette Pac MS, RDN

## 2021-11-06 ENCOUNTER — Ambulatory Visit: Payer: BLUE CROSS/BLUE SHIELD

## 2021-11-06 DIAGNOSIS — R635 Abnormal weight gain: Secondary | ICD-10-CM

## 2021-11-07 ENCOUNTER — Ambulatory Visit: Payer: BLUE CROSS/BLUE SHIELD

## 2021-11-07 ENCOUNTER — Inpatient Hospital Stay: Payer: BLUE CROSS/BLUE SHIELD

## 2021-11-12 ENCOUNTER — Ambulatory Visit: Payer: BLUE CROSS/BLUE SHIELD

## 2021-11-12 ENCOUNTER — Institutional Professional Consult (permissible substitution): Payer: BLUE CROSS/BLUE SHIELD

## 2021-11-12 DIAGNOSIS — R635 Abnormal weight gain: Secondary | ICD-10-CM

## 2021-11-19 ENCOUNTER — Ambulatory Visit: Payer: BLUE CROSS/BLUE SHIELD

## 2021-12-03 ENCOUNTER — Ambulatory Visit: Payer: BLUE CROSS/BLUE SHIELD | Attending: Student in an Organized Health Care Education/Training Program

## 2021-12-03 NOTE — Progress Notes
[facility] Follow-Up Note    Diet: MVLCD  START WT 10/01/2021: (!) 322 lb 6.4 oz (146.2 kg)  s/p lap band removal     Subjective:  Patient has been doing well,  is not losing weight.     Has been traveling so hard to stick to diet plan. Had dinners out, cocktail meetings.     Exercise - Started 3 days per week doing strength training     Diet  - Patient is consuming recommended amount of product  - Amount of water > 2 liters    PLAN: MVLCD 3 zpros, 1 Nutrimed, 1 soup, 1 chip, 1 bar+ 1 meal: 2 prot, 2 veg, 1 fruit  TOTAL PROTEIN: 185g  TOTAL CALORIES: ~1,400  Weight in (lb) to have BMI = 25: 152.2  Ideal body weight: 62.7 kg (138 lb 1.9 oz)  Adjusted ideal body weight: 95.5 kg (210 lb 7.5 oz)  Weight history:     Body mass index is 52.28 kg/m?Marland Kitchen  Wt Readings from Last 10 Encounters:   12/03/21 (!) 319 lb (144.7 kg)   11/12/21 (!) 317 lb 3.2 oz (143.9 kg)   11/05/21 (!) 317 lb 3.2 oz (143.9 kg)   10/22/21 (!) 315 lb 12.8 oz (143.2 kg)   10/15/21 (!) 316 lb 6.4 oz (143.5 kg)   10/09/21 (!) 319 lb 12.8 oz (145.1 kg)   10/08/21 (!) 318 lb 12.8 oz (144.6 kg)   10/07/21 (!) 319 lb 9.6 oz (145 kg)   10/01/21 (!) 322 lb 6.4 oz (146.2 kg)   03/14/21 (!) 300 lb 11.3 oz (136.4 kg)       PMH:  Past Medical History:   Diagnosis Date   ? Empyema (HCC/RAF)    ? Obesity         CURRENT MEDICATIONS:  Medications that the patient states to be currently taking   Medication Sig   ? omeprazole 40 mg DR capsule Take 1 capsule (40 mg total) by mouth daily.          Objective:  Vitals:   Last Recorded Vital Signs:    12/03/21 0909   BP: 132/89   Pulse: 86       Lab Results   Component Value Date    WBC 8.1 10/01/2021    HGB 16.0 10/01/2021    HCT 47.9 10/01/2021    PLT 233 10/01/2021    NEUTABS 4,649 10/01/2021    LYMPHABS 2,309 10/01/2021     Results for orders placed or performed in visit on 11/20/20   Lipid Panel   Result Value Ref Range    Cholesterol 147 See Comment mg/dL    Cholesterol,LDL,Calc 94 <100 mg/dL Cholesterol, HDL 34 (L) >40 mg/dL    Triglycerides 93 <454 mg/dL    Non-HDL,Chol,Calc 098 <130 mg/dL     Lab Results   Component Value Date    NA 140 11/12/2021    K 5.0 11/12/2021    CL 105 11/12/2021    GLUCOSE 86 11/12/2021    CREAT 1.01 11/12/2021    CALCIUM 9.1 11/12/2021    ALBUMIN 4.1 10/01/2021    AST 22 10/01/2021    ALT 36 10/01/2021    ALKPHOS 49 10/01/2021     Lab Results   Component Value Date    CHOL 147 10/01/2021    CHOL 147 11/20/2020    CHOLHDL 46 10/01/2021    CHOLHDL 34 (L) 11/20/2020    CHOLDLCAL 82 10/01/2021  CHOLDLCAL 94 11/20/2020    TRIGLY 99 10/01/2021    TRIGLY 93 11/20/2020       Labs reviewed  BIA:      10/01/2021    10:24 AM   Body Composition RFO   Weight: Bio Impedance Analysis 323 lbs   BMR 2665   Body Fat % 41.9   Fat Mass 135.34   FFM 187.66   TBW (total body water) 137 lbs 6 oz         Assessment:  Patient  is adherent to diet.  The compliance with the program is  good          Plan  -Continue in the [facility] program with MVLCD     -Calorie level is 1400    -Activity: Continue strength training 3 times per week    -Medication changes: none, would consider GLP1 when available.     -Notable labs: 10/4 BMP wnl    Author: Toni Amend L. Alexander Bergeron, MD

## 2021-12-10 ENCOUNTER — Ambulatory Visit: Payer: BLUE CROSS/BLUE SHIELD

## 2021-12-24 ENCOUNTER — Ambulatory Visit: Payer: BLUE CROSS/BLUE SHIELD

## 2021-12-24 ENCOUNTER — Institutional Professional Consult (permissible substitution): Payer: BLUE CROSS/BLUE SHIELD | Attending: Registered"

## 2021-12-24 NOTE — Consults
[facility]  NEW PATIENT NOTE    DATE OF SERVICE: 12/24/2021    ANTHROPOMETRICS  (!) 145.5 kg (320 lb 12.8 oz)  1.664 m (5' 5.5'')    BMI:   Body mass index is 52.57 kg/m.     Weight in (lb) to have BMI = 25: 152.2  Ideal body weight: 62.7 kg (138 lb 1.9 oz)  Adjusted ideal body weight: 95.8 kg (211 lb 3.1 oz)    SUBJECTIVE/WT HX: Pt started exercising with a personal trainer. Pt is not drinking enough water.       START WT 12/24/2021: (!) 145.5 kg (320 lb 12.8 oz)  BIA INFORMATION      10/01/2021    10:24 AM   Body Composition RFO   Weight: Bio Impedance Analysis 323 lbs   BMR 2665   Body Fat % 41.9   Fat Mass 135.34   FFM 187.66   TBW (total body water) 137 lbs 6 oz     WEIGHT GOAL: 220#  PAST MED HX: Empyema, obesity  DIET RESTRICTIONS: none    CURRENT PHYSICAL ACTIVITY: trainer weight lifting 2x/week and walking 1 mile 2x/week   BARRIERS TO PHYSICAL ACTIVITY: none    24-HOUR RECALL OF FOOD/BEVERAGE INTAKE:  BREAKFAST: premiere protein   LUNCH: premiere protein   Snack: protein bar  DINNER: Taco salad   SNACKS: protein bar, protein chips   BEVERAGES: 48 oz water   ESTIMATED CALORIE INTAKE: 1400 kcal     GOAL/RECOMMEDATIONS:  1. Start MVLCD Program   2. Rec to reach 2-3 L water   3. Hold off on activity until adjusted to diet (2 weeks)  4. Discussed rare side effects associated w/ MVLCD     Nutrition RX:  PLAN: MVLCD 3 zpros, 1 Nutrimed, 1 soup, 1 chip, 1 bar+ 1 meal: 2 prot, 2 veg, 1 fruit  TOTAL PROTEIN: 185g  TOTAL CALORIES: ~1,400                    Rozanna Box MS, RDN

## 2021-12-29 ENCOUNTER — Telehealth: Payer: BLUE CROSS/BLUE SHIELD

## 2021-12-30 NOTE — Telephone Encounter
Left vm to confirm EGD on 12/01 with Dr. Imogene Burn, left instructions to hold off blood thinners & patient must have a ride after EGD.

## 2021-12-31 ENCOUNTER — Ambulatory Visit: Payer: BLUE CROSS/BLUE SHIELD

## 2022-01-07 ENCOUNTER — Ambulatory Visit: Payer: BLUE CROSS/BLUE SHIELD

## 2022-01-09 LAB — Basic Metabolic Panel
ANION GAP: 11 mmol/L (ref 8–19)
GLUCOSE: 93 mg/dL (ref 65–99)

## 2022-01-09 MED ADMIN — PROPOFOL 200 MG/20ML IV EMUL (ANES): INTRAVENOUS | @ 18:00:00 | Stop: 2022-01-09

## 2022-01-09 MED ADMIN — LIDOCAINE HCL (CARDIAC) 100 MG/5ML IV SOSY: INTRAVENOUS | @ 17:00:00 | Stop: 2022-01-09 | NDC 76329339001

## 2022-01-09 MED ADMIN — SUCCINYLCHOLINE CHLORIDE 20 MG/ML IJ SOLN: INTRAVENOUS | @ 17:00:00 | Stop: 2022-01-09 | NDC 00781341195

## 2022-01-09 MED ADMIN — PROPOFOL 200 MG/20ML IV EMUL (ANES): INTRAVENOUS | @ 17:00:00 | Stop: 2022-01-09

## 2022-01-09 MED ADMIN — PLASMA-LYTE A IV SOLN: INTRAVENOUS | @ 17:00:00 | Stop: 2022-01-09 | NDC 00338022104

## 2022-01-09 MED ADMIN — PLASMA-LYTE A IV SOLN: @ 17:00:00 | Stop: 2022-01-09

## 2022-01-09 MED ADMIN — PROPOFOL 200 MG/20ML IV EMUL: INTRAVENOUS | @ 17:00:00 | Stop: 2022-01-09 | NDC 63323026929

## 2022-01-09 NOTE — Discharge Instructions
Upper GI Endoscopy with Biopsy  Upper GI endoscopy is a test that looks inside your upper GI (gastrointestinal) tract. This includes your food pipe (esophagus) and stomach. And it includes the first part of your small intestine (duodenum). The test is also known as EGD (esophagogastroduodenoscopy). It's done using a tool called an endoscope. This is a long, thin, flexible tube. It has a tiny camera at one end. The test helps find problems such as ulcers, infection, or growths. It can check for celiac disease, gastritis, and esophagitis. During the test, tissue samples (biopsies) may be taken. These are studied for problems.     Getting ready for your procedure  Follow any instructions from your healthcare provider.   Tell your provider about any medicines you are taking. This includes prescription and over-the-counter medicines, vitamins, herbs, and other supplements. You may need to stop taking some or all of these before the test.   Follow any directions you?re given for not eating or drinking before the test.   The day of the procedure  The procedure takes about 20 minutes. You'll go home the same day.   Before the procedure begins:  You may be given medicine to help you relax or sleep (sedation). This is given through an IV (intravenous) line placed in a vein in your arm or hand. Your throat may be numbed with a spray or liquid. You'll be given a small plastic guard to protect your teeth. You'll be given oxygen to breathe through small prongs (cannula) that fit just inside your nose. You'll be connected to a machine to watch your heartbeat.   During the procedure:  You lie on your left side. The endoscope is placed in your mouth, and it moves down your throat.  Air is used to expand your GI tract. This helps the lining be seen more clearly. You may feel pressure or mild pain from the air.  The scope sends pictures of your GI tract to a computer screen. The esophagus, stomach, and duodenum are viewed.  Problems may be seen and treated. These include bleeding, redness or swelling (inflammation), or growths. Using tools passed through the endoscope, small samples of tissue (biopsy) can be taken. In some cases, small growths can be removed. Other treatments may be done such as stretching (dilating) a narrowed area.  The endoscope is then removed.  After the procedure:   The healthcare provider will talk with you later about the results. You?ll rest and be monitored until you can go home. Have an adult family member or friend drive you. Relax for the rest of the day. If you had a biopsy, the results will be ready in about 7 days.   Recovering at home  You?ll likely feel drowsy after the test. A mild sore throat, mild gas, and bloating are normal. Once you're home, follow any instructions you've been given. If you had sedation, don't drive, run machinery, or make major decisions until the next day.   When to call your healthcare provider  Call your healthcare provider if you have any of these:  Fever of 100.4?F (38?C) or higher, or as advised by your provider  Shaking chills  Chest pain  Dark-colored stools  Severe belly (abdominal) pain that doesn't go away when passing gas  Sore throat that doesn?t go away  Trouble swallowing  Vomiting, especially with blood  Any other signs or symptoms indicated by your healthcare provider  Follow-up care  If you had a biopsy, the results will be  ready in about 7 days. Your healthcare provider will talk with you about any more testing or treatment that may be needed.   Risks and possible complications  Sore throat or hoarseness  Bloating  Nausea  Allergic reaction to the sedative or numbing medicine  Bleeding during or after the procedure  Too much bleeding from the biopsy site (if a biopsy is done)  A hole or tear (perforation) in the lining of the digestive tract  Inhaling food or fluid into the lungs (aspiration)  Irregular heartbeat or cardiac arrest in someone with heart or lung disease    StayWell last reviewed this educational content on 06/09/2020  ? 2000-2023 [employer], [place]. All rights reserved. This information is not intended as a substitute for professional medical care. Always follow your healthcare professional's instructions.      Anesthesia: General Anesthesia  You?re due to have surgery. During surgery, you?ll be given medicine called anesthesia or anesthetic. This will keep you comfortable and pain-free. Your anesthesia provider will use general anesthesia .     You are watched continuously during your procedure by your anesthesia provider.     What is general anesthesia?  General anesthesia puts you into a state like deep sleep. It goes into the bloodstream (IV anesthetics), into the lungs (gas anesthetics), or both. You feel nothing during the procedure. You won't remember it. During the procedure, the anesthesia provider watches you continuously. They check your heart rate and rhythm, blood pressure, breathing, and blood oxygen.   IV anesthetics. IV anesthetics are given through an IV (intravenous) line in your arm. They?re often given first. This is so you're asleep before a gas anesthetic is started. Some kinds of IV anesthetics ease pain. Others relax you. Your healthcare provider will decide which kind is best in your case.  Gas anesthetics. Gas anesthetics are breathed into the lungs. They're often used to keep you asleep. They can be given through a face mask. Or they can be given through a tube placed in your voice box (larynx) or breathing tube (trachea).  Face mask. Your anesthesia provider will most likely place the face mask over your nose and mouth while you?re still awake. You?ll breathe oxygen through the mask as your IV anesthetic is started. Gas anesthetic may be added through the mask.  Tube in the larynx or trachea. The tube will be inserted into your throat after you?re asleep.  Anesthesia tools and medicines  You will likely have:  IV anesthetics. These are put into an IV line into your bloodstream.  Gas anesthetics. You breathe these anesthetics into your lungs. Then they pass into your bloodstream.  Pulse oximeter. This is a small clip that's attached to the end of your finger. It measures your blood oxygen level.  Electrocardiography leads (electrodes).  These are small sticky pads that are placed on your chest. They record your heart rate and rhythm.  Blood pressure cuff. This reads your blood pressure.  Risks and possible complications  General anesthesia has some risks. These include:   Breathing problems  Upset stomach (nausea) and vomiting  Sore throat or hoarseness (usually temporary)  Allergic reaction to the anesthetic  Irregular heartbeat (rare)  Cardiac arrest (rare)  Anesthesia safety  Follow any directions you're given for not eating or drinking before your procedure.  Tell your healthcare provider knows what medicines you take. This includes prescription and over-the-counter medicines. It includes vitamins, herbs, and other supplements. You'll be asked when those were last taken.  Have an adult family member or friend drive you home after the procedure.  For the first 24 hours after your surgery:  Don't drive or use heavy equipment.  Don't make important decisions or sign legal documents. If important decisions or signing legal documents is necessary during the first 24 hours after surgery, have a trusted family member or spouse act on your behalf.  Don't drink alcohol.  Have a responsible adult stay with you. They can watch for problems and help keep you safe.    StayWell last reviewed this educational content on 11/10/2019  ? 2000-2023 [employer], [place]. All rights reserved. This information is not intended as a substitute for professional medical care. Always follow your healthcare professional's instructions.

## 2022-01-09 NOTE — Procedures
PATIENT NAME:  Tanksley, Naji  DATE OF BIRTH:  [birth year]  RECORD NUMBER: [id number]  DATE/TIME OF PROCEDURE: 01/09/2022 / 08:30:00 AM  ENDOSCOPIST:  Camie Patience, MD  REFERRING PHYSICIAN:    FELLOW:      INDICATIONS FOR EXAMINATION: Abdominal pain               PROCEDURE PERFORMED:             UPPER GI ENDOSCOPY - biopsy    MEDICATIONS:    Propofol    PROCEDURE TECHNIQUE:  Patient's medications, allergies, past medical, surgical, social and family histories were reviewed and updated as appropriate. A discussion of informed consent was had with the patient and/or the patient's family prior to the procedure, including   sedation.  The alternatives, benefits and risks of  the procedure including but not limited to perforation, hemorrhage, infection, adverse drug reaction and aspiration were discussed    Procedure Details:     Informed consent was obtained for the procedure, including sedation.  Risks of perforation, hemorrhage, infection, adverse drug reaction and aspiration were discussed. The patient was placed in position.  Based on the pre-procedure assessment, including   review of the patient's medical history, medications, allergies, and review of systems, the patient had been deemed to be an appropriate candidate for conscious sedation; the patient was therefore sedated with the medications listed.  The patient was   monitored continuously with pulse oximetry, blood pressure monitoring, and direct observations.      The [id number] #[id number] was introduced and passed without difficulty to second part of duodenum. A careful inspection was made as the endoscope was withdrawn.    Findings and interventions are described below:    Total Withdrawl Time:   Total Insertion Time:                              EXTENT OF EXAM:  second part of duodenum            INSTRUMENTS:   [id number]  TECHNICAL DIFFICULTY:  No   LIMITATIONS:       TOLERANCE:   Good  VISUALIZATION:  Good FINDINGS:   The uper and mid esophagus appeared normal. Grade 2 esophagitis seen. Biopsies were taken to rule out Barrett's esophagus. The z-line was at 38-cm. There was a 2 cm axial hiatal hernia. However, on retrograde view, Hill grade 1 valve was seen.  The   gastric fundus, body appeared normal.  A pancreatic rest seen at the antrum. Biopsies were taken. The duodenal bulb and the second portion of the duodenum appeared normal.    ESTIMATED BLOOD LOSS:   None     DIAGNOSIS:  -Grade 2 esophagitis  -Pancreatic rest in stomach  -Normal duodenum    RECOMMENDATIONS:  - follow up biopsy results            This electronic signature authenticates all electronic and/or handwritten documentation, including orders, generated by the signer during the episode of care contained in this record.  01/09/2022 09:41:37 AM By Camie Patience

## 2022-01-09 NOTE — H&P
UPDATED H&P REQUIREMENT    For Ardyth Harps [facility] and Norval Gable [facility] and [facility]    WHAT IS THE STATUS OF THE PATIENT'S MOST CURRENT HISTORY AND PHYSICAL?   - There is no recent H&P <30 days.  Proceed to H&P Notes section for new H&P.     REFER TO MEDICAL STAFF POLICIES REGARDING PRE-PROCEDURE HISTORY AND PHYSICAL EXAMINATION AND UPDATED H&P REQUIREMENTS BELOW:    Ardyth Harps [facility] and [facility] and [facility] Medical Staff Policy 200 - For Patients Undergoing Procedures Requiring Moderate or Deep Sedation, General Anesthesia or Regional Anesthesia    Contents of a History and Physical Examination (H&P):    The H&P shall consist of chief complaint, history of present illness, allergies and medications, relevant social and family history, past medical history, review of systems and physical examination, and assessment and plan appropriate to the patient's age.    For Patients Undergoing Procedures Requiring Moderate or Deep Sedation, General Anesthesia or Regional Anesthesia:    1. An H&P shall be performed within 24 hours prior to the procedure by a qualified member of the medical staff or designee with appropriate privileges, except as noted in item 2 below.    2. If a complete history and physical was performed within thirty (30) calendar days prior to the patient?s admission to the Medical Center for elective surgery, a member of the medical staff assumes the responsibility for the accuracy of the clinical information and will need to document in the medical record within twenty-four (24) hours of admission and prior to surgery or major invasive procedure, that they either attest that the history and physical has been reviewed and accepted, or document an update of the original history and physical relevant to the patient's current clinical status.    3. Providing an H&P for patients undergoing surgery under local anesthesia is at the discretion of the Attending Physician.     4. When a procedure is performed by a dentist, podiatrist or other practitioner who is not privileged to perform an H&P, the anesthesiologist?s assessment immediately prior to the procedure will constitute the 24 hour re-assessment.The dentist, podiatrist or other practitioner who is not privileged to perform an H&P will document the history and physical relevant to the procedure.    5. If the H&P and the written informed consent for the surgery or procedure are not recorded in the patient's medical record prior to surgery, the operation shall not be performed unless the attending physician states in writing that such a delay could lead to an adverse event or irreversible damage to the patient.    6. The above requirements shall not preclude the rendering of emergency medical or surgical care to a patient in dire circumstances.

## 2022-01-09 NOTE — Brief Op Note
Brief Operative/Procedure Note    Patient: Russell Wiggins    Date of Operation(s)/Procedure(s): 01/09/2022    Pre-op Diagnosis: Generalized abdominal pain [R10.84]  History of removal of laparoscopic gastric banding device [Z98.84]       Post-op Diagnosis: Same    Operation(s)/Procedure(s):  UPPER GI ENDOSCOPY w/ BX    Surgeon(s) and Role:     Camie Patience, MD - Primary     * Ashantae Pangallo, Polo Riley., MD - Surgeon 1st Assist - Resident    Anesthesia Staff and Role:  Anesthesia Resident: Barry Brunner., MD  Anesthesiologist: Phylliss Bob., MD    Anesthesia Type:   General    Pre-Op Medications: None    Intra-op Medications: (Antibiotics, Anticoagulants, Immunosuppressants)  * No intraprocedure medications in log *      Blood Products: none    Fluids: 250cc    Estimated Blood Loss: Minimal    Findings: Fold in gastric antrum,biopsied. Hill Grade A, 2cm hiatal hernia, esophagitis, biopsied    Complications: None; patient tolerated the operation(s)/procedure(s) well.                 Specimens:   ID Type Source Tests Collected by Time   1 : A  fold  : bx 'd  Tissue Stomach, antrum TISSUE EXAM/FOREIGN BODY (AP) Camie Patience, MD 01/09/2022 [phone]   2 : bx'd  at [facility] :R/O Barrett's Tissue Esophagus TISSUE EXAM/FOREIGN BODY (AP) Camie Patience, MD 01/09/2022 [phone]       Drains:             Staff and Role:   Circulating Nurse: James Ivanoff; Claiborne Billings, Hollie Salk, RN  Technician: Weber Cooks  Chaperone: Claiborne Billings, Hollie Salk, RN    Polo Riley Harrie Foreman, MD    Date: 01/09/2022  Time: 9:22 AM

## 2022-01-09 NOTE — H&P
MINIMALLY INVASIVE SURGERY SERVICE PREOPERATIVE HISTORY AND PHYSICAL    PATIENT: Russell Wiggins  MRN: [id number]  DOB: [birth year]  DATE OF SERVICE: 01/09/2022   ATTENDING: Imogene Burn    Subjective:     History of Present Illness:  Russell Wiggins is a 46 y.o. male with a history of AGB removal presenting with pain and reflux post procedure.     There have been no changes to his health since last seen in clinic on 10/09/21. Denies fevers, chills, cough, chest pain, shortness of breath, new/severe abdominal pain, nausea, vomiting, any new/concerning sx.    Past History:  Past Medical History:   Diagnosis Date   ? Empyema (HCC/RAF)    ? Obesity         Past Surgical History:   Procedure Laterality Date   ? HIP FRACTURE SURGERY Right    ? LAPAROSCOPIC GASTRIC BANDING     ? LUNG SURGERY         Family History:   Family History   Problem Relation Age of Onset   ? Lymphoma Father    ? Obesity Father    ? Diabetes type II Maternal Grandfather    ? Parkinsonism Paternal Grandfather    ? Osteoporosis Paternal Aunt    ? Obesity Paternal Uncle    ? Anesthesia problems Neg Hx    ? Malignant hypertension Neg Hx    ? Malignant hyperthermia Neg Hx    ? Hypotension Neg Hx    ? Pseudochol deficiency Neg Hx        Social History:   Social History     Socioeconomic History   ? Marital status: Married   Tobacco Use   ? Smoking status: Never   ? Smokeless tobacco: Never   Substance and Sexual Activity   ? Alcohol use: Yes     Comment: one drink/week   ? Drug use: No   ? Sexual activity: Yes     Partners: Female   Other Topics Concern   ? Do you exercise at least a day, 3 or more days a week? No       Allergies: No Known Allergies     Medications:  Prior to Admission medications    Medication Sig Start Date End Date Taking? Authorizing Provider   omeprazole 40 mg DR capsule Take 1 capsule (40 mg total) by mouth daily. 10/09/21   Camie Patience, MD       Review of Systems:   Negative except for as noted in the HPI.    Objective: Physical Exam:    Vital Signs: BP 142/79  ~ Pulse 80  ~ Temp 36.2 ?C (97.2 ?F) (Temporal)  ~ Resp 18  ~ Ht 1.664 m (5' 5.5'')  ~ Wt (!) 146.8 kg (323 lb 10.2 oz)  ~ SpO2 99%  ~ BMI 53.04 kg/m?       General: No acute distress, alert, appropriate  Respiratory: Breathing unlabored on room air, breath sounds present bilaterally  Abdomen: Soft, non-tender, non-distended  Extremities: Warm, well perfused, no edema  Psych: Normal affect, normal linear thought processes    Labs:  No results found for this or any previous visit (from the past 24 hour(s)).    Studies:  None    Assessment/Plan:     Russell Wiggins is a 46 y.o. male with a history of AGB removal 10 months ago now with postprandial pain and reflux, presenting today for EGD to evaluate symptoms.    Risks/benefits  procedure discussed with patient, including but not limited to bleeding, infection, damage to surrounding structures, respiratory problems, aspiration, pneumonia, discomfort, perforation, need for repeat procedures. Patient understands, all questions answered, informed consent obtained.    The patient has been informed of the risks and benefits of the procedure, as well as alternatives. All questions have been answered satisfactorily and he has been consented for surgery.    Proceed with EGD with possible interventions, moderate sedation    Patient was seen and examined by and plan developed with the attending physician, Dr. Imogene Burn.    Author: Polo Riley. Harrie Foreman, MD 01/09/2022 8:44 AM

## 2022-01-14 ENCOUNTER — Ambulatory Visit: Payer: BLUE CROSS/BLUE SHIELD

## 2022-01-14 LAB — Tissue Exam

## 2022-01-15 LAB — Tissue Exam

## 2022-02-27 ENCOUNTER — Ambulatory Visit: Payer: BLUE CROSS/BLUE SHIELD

## 2022-03-11 ENCOUNTER — Ambulatory Visit: Payer: BLUE CROSS/BLUE SHIELD

## 2022-03-12 ENCOUNTER — Ambulatory Visit: Payer: BLUE CROSS/BLUE SHIELD

## 2022-03-22 ENCOUNTER — Inpatient Hospital Stay: Payer: BLUE CROSS/BLUE SHIELD

## 2022-03-22 DIAGNOSIS — Z6841 Body Mass Index (BMI) 40.0 and over, adult: Secondary | ICD-10-CM

## 2022-03-23 MED ADMIN — BARIUM SULFATE 2.1% PO SUSP PLAIN: 900 mL | ORAL | Stop: 2022-03-23 | NDC 32909071503

## 2022-03-23 MED ADMIN — IOHEXOL 350 MG/ML IV SOLN: 125 mL | INTRAVENOUS | Stop: 2022-03-23 | NDC 00407141476

## 2022-03-24 ENCOUNTER — Ambulatory Visit: Payer: BLUE CROSS/BLUE SHIELD

## 2022-04-03 ENCOUNTER — Ambulatory Visit: Payer: BLUE CROSS/BLUE SHIELD

## 2022-04-08 ENCOUNTER — Ambulatory Visit: Payer: BLUE CROSS/BLUE SHIELD

## 2022-04-13 ENCOUNTER — Ambulatory Visit: Payer: BLUE CROSS/BLUE SHIELD

## 2022-04-17 ENCOUNTER — Ambulatory Visit: Payer: BLUE CROSS/BLUE SHIELD

## 2022-04-27 ENCOUNTER — Ambulatory Visit: Payer: BLUE CROSS/BLUE SHIELD

## 2022-06-24 ENCOUNTER — Ambulatory Visit: Payer: BLUE CROSS/BLUE SHIELD

## 2022-06-24 DIAGNOSIS — Z6841 Body Mass Index (BMI) 40.0 and over, adult: Secondary | ICD-10-CM

## 2022-06-24 DIAGNOSIS — Z0000 Encounter for general adult medical examination without abnormal findings: Secondary | ICD-10-CM

## 2022-06-24 DIAGNOSIS — K219 Gastro-esophageal reflux disease without esophagitis: Secondary | ICD-10-CM

## 2022-06-24 DIAGNOSIS — R0789 Other chest pain: Secondary | ICD-10-CM

## 2022-06-24 NOTE — H&P
[facility]    Comprehensive History & Physical Exam        PATIENT: Russell Wiggins  MRN: [id number]  DOB: Feb 23, 1975  DATE OF SERVICE: 06/24/2022    PRIMARY CARE PROVIDER: Marcelene Butte, MD    Subjective:     Russell Wiggins is a 47 y.o. male is here today for his annual physical exam and other issues:    Issues addressed today:    1. Obesity: wants to f/u with Dr. Imogene Burn for bariatric surgery.   2. GERD: stable on prilosec.  3. Chest discomfort: mild intermittent, thiks it could be related to gerd but wants to make sure its not his heart, no HTN no HL.    Routine Health Maintenance:  Immunizations: utd  Colonoscopy: utd      Past Medical History:   Diagnosis Date    Empyema (HCC/RAF)     Obesity      Past Surgical History:   Procedure Laterality Date    HIP FRACTURE SURGERY Right     LAPAROSCOPIC GASTRIC BANDING      LUNG SURGERY       Family History   Problem Relation Age of Onset    Lymphoma Father     Obesity Father     Diabetes type II Maternal Grandfather     Parkinsonism Paternal Grandfather     Osteoporosis Paternal Aunt     Obesity Paternal Uncle     Anesthesia problems Neg Hx     Malignant hypertension Neg Hx     Malignant hyperthermia Neg Hx     Hypotension Neg Hx     Pseudochol deficiency Neg Hx      Social History     Socioeconomic History    Marital status: Married   Tobacco Use    Smoking status: Never    Smokeless tobacco: Never   Substance and Sexual Activity    Alcohol use: Yes     Comment: one drink/week    Drug use: No    Sexual activity: Yes     Partners: Female   Other Topics Concern    Do you exercise at least a day, 3 or more days a week? No     Outpatient Medications Prior to Visit   Medication Sig    omeprazole 40 mg DR capsule Take 1 capsule (40 mg total) by mouth daily.     No facility-administered medications prior to visit.     No Known Allergies    Review of Systems:   Constitutional: Denies fever, chills, sweats, generalized weakness.  Integumentary: Denies rashes, lesions, pruritis  Eyes: Denies vision changes, eye pain.   ENMT: Denies hearing loss, ear pain, mouth sores/pain, sore throat  Cardiovascular: + chest pain, SOB, DOE, palpitations,  Respiratory: Denies SOB, cough, sputum production, wheezing.  Gastrointestinal: Denies nausea, vomiting, diarrhea, constipation, +abdominal pain  Genitourinary: Denies dysuria, frequency, urgency, incontinence, Denies vaginal/penile discharge.  Musculoskeletal: Denies arthralgias, myalgias, swelling of joints.  Neurologic: Denies headache, paresthesias, memory problems, dizziness/vertigo.  Psychiatric: Denies anxiety, depression, change in sleep patterns.  Endocrinologic: Denies hot/cold intolerance, polydipsia, polyuria, polyphagia.    Objective:     Vitals:  BP 135/88  ~ Pulse (!) 102  ~ Temp 36.4 ?C (97.6 ?F)  ~ Ht 5' 5.5'' (1.664 m)  ~ Wt (!) 347 lb (157.4 kg)  ~ BMI 56.87 kg/m?    General:   alert and cooperative    Normocephalic, without obvious abnormality, atraumatic  Skin:   Skin color, texture, turgor normal. No rashes or lesions   Eyes:   conjunctivae/corneas clear. PERRL, EOM's intact.   Ears:   normal TM's and external ear canals both ears   Nose:  Nares normal. Mucosa normal. No drainage.   Throat:  oropharynx clear   Mouth:   No perioral or gingival cyanosis or lesions.  Tongue is normal in appearance.   Neck:  no adenopathy, supple, symmetrical, trachea midline and thyroid not enlarged, symmetric, no tenderness/mass/nodules   Lungs:   clear to auscultation bilaterally, normal respiratory effort   Heart:   regular rate and rhythm, S1, S2 normal, no murmur, click, rub or gallop   Abdomen:   soft, non-tender, no masses,  no organomegaly       Musculoskeletal:  Full range of motion of all extremities   Extremities:  extremities normal, no cyanosis or edema   Lymph Nodes:  cervical and supraclavicular nodes normal.   Neurologic:   normal strength and tone. Normal coordination and gait Psychiatric:   mood and affect are within normal limits     Assessment & Plan:     Russell Wiggins is a 47 y.o. male here for his annual physical exam.  Today we addressed the following issues:    Diagnoses and all orders for this visit:    Physical exam  -     CBC; Future  -     Comprehensive Metabolic Panel; Future  -     HIV-1/2 Ag/Ab 4th Generation with Reflex Confirmation; Future  -     TSH with reflex FT4, FT3; Future    Class 3 severe obesity due to excess calories without serious comorbidity with body mass index (BMI) of 50.0 to 59.9 in adult (HCC/RAF)  -     Lipid Panel; Future  -     Referral to Surgery, General  -     Hgb A1c; Future  -     Referral to Cardiology    Gastroesophageal reflux disease, unspecified whether esophagitis present  -cont prilosec.    Chest discomfort  -     Referral to Cardiology    #Routine Health Maintenance:  -Immunizations: utd  -Colonoscopy: utd      Marcelene Butte, MD  Family Medicine  [facility]  [facility]

## 2022-07-07 ENCOUNTER — Telehealth: Payer: BLUE CROSS/BLUE SHIELD

## 2022-07-07 NOTE — Telephone Encounter
Pt left voicemail wanting to schedule an appt. I called and left vm returning his call.

## 2022-08-03 ENCOUNTER — Telehealth: Payer: BLUE CROSS/BLUE SHIELD

## 2022-08-06 ENCOUNTER — Ambulatory Visit: Payer: BLUE CROSS/BLUE SHIELD

## 2022-08-06 DIAGNOSIS — Z713 Dietary counseling and surveillance: Secondary | ICD-10-CM

## 2022-08-06 NOTE — Progress Notes
[facility]  NEW BARIATRIC PATIENT EVALUATION    TYPE OF BARIATRIC SURGERY BEING CONSIDERED: Roux en-Y Gastric Bypass or Vertical Sleeve Gastrectomy    MEDICAL/SURGICAL HISTORY:  Past Medical History:   Diagnosis Date    Empyema (HCC/RAF)     Obesity      Past Surgical History:   Procedure Laterality Date    HIP FRACTURE SURGERY Right     LAPAROSCOPIC GASTRIC BANDING      LUNG SURGERY         MEDICATIONS:  Outpatient Medications Prior to Visit   Medication Sig    omeprazole 40 mg DR capsule Take 1 capsule (40 mg total) by mouth daily.     No facility-administered medications prior to visit.       LABS:  Results for orders placed or performed in visit on 06/24/22   Hgb A1c   Result Value Ref Range    Hgb A1c 5.7 (H) <5.7 %        Results for orders placed or performed in visit on 06/24/22   Lipid Panel   Result Value Ref Range    Cholesterol 146 See Comment mg/dL    Cholesterol,LDL,Calc 82 <100 mg/dL    Cholesterol, HDL 35 (L) >40 mg/dL    Triglycerides 782 <956 mg/dL    Non-HDL,Chol,Calc 213 <130 mg/dL             Results for orders placed or performed in visit on 06/24/22   Comprehensive Metabolic Panel   Result Value Ref Range    Sodium 139 135 - 146 mmol/L    Potassium 4.4 3.6 - 5.3 mmol/L    Chloride 106 96 - 106 mmol/L    Total CO2 24 20 - 30 mmol/L    Anion Gap 9 8 - 19 mmol/L    Glucose 101 (H) 65 - 99 mg/dL    Creatinine 0.86 5.78 - 1.30 mg/dL    Estimated GFR >46 See GFR Additional Information mL/min/1.83m2    GFR Additional Information See Comment     Urea Nitrogen 15 7 - 22 mg/dL    Calcium 9.4 8.6 - 96.2 mg/dL    Total Protein 7.3 6.1 - 8.2 g/dL    Albumin 4.0 3.9 - 5.0 g/dL    Bilirubin,Total 0.3 0.1 - 1.2 mg/dL    Alkaline Phosphatase 64 37 - 113 U/L    Aspartate Aminotransferase 37 13 - 62 U/L    Alanine Aminotransferase 44 8 - 70 U/L                    ANTHROPOMETRICS  (!) 348 lb (157.9 kg)  5' 5.5'' (1.664 m)    BMI: Body mass index is 57.03 kg/m?Marland Kitchen     Weight in (lb) to have BMI = 25: 152.2  Ideal body weight: 62.7 kg (138 lb 1.9 oz)  Adjusted ideal body weight: 100.7 kg (222 lb 1.1 oz)    WEIGHT HISTORY    RECENT WEIGHT CHANGE: last year has gained ~50 lbs  HEAVIEST ADULT WEIGHT: 348 lbs     SOCIAL HISTORY:  Living situation: wife and 3 kids  Food shopping: wife   Cooking: wife   (eats away from home or takeout 30% of week)  Access to food:  no barriers    CURRENT PHYSICAL ACTIVITY: none prior to knee injury   BARRIERS TO PHYSICAL ACTIVITY: hurt knee recently     FOOD/DIET HISTORY & ASSESSMENT:  Previous trial of supervised  weight loss diets: Yes  weight loss program at [facility] recently   Motivation to lose weight: Fair  Portion Control Knowledge: Fair  Comprehension/Understanding of Reading Food Labels: Fair    USUAL BEVERAGE CONSUMPTION:  Water: 32 oz   Tea: No   Coffee: No     Soda (including diet):  Yes diet coke 1x/daily   Juices: No   Sports / Energy:  No    EtOH: No      SUPPLEMENTS: none     24-HOUR RECALL OF FOOD INTAKE:     TIME INTAKE   Breakfast 2 sausage patties and hash brown, apple slices    Snack    Lunch [employer] plate - chicken rice and humus (take out)    Snack Granola bar    Dinner Pizza Bbq chicken    Snack      ESTIMATED CALORIE INTAKE: 2500-3000 kcal    VISIT NOTE:  Patient presents with Super obesity, Body mass index is 57.03 kg/m?Marland Kitchen and with the following comorbid conditions: Pre diabetes.     Initial bariatric surgery consult. Wt 348 lbs. Noted Dr. Imogene Burn removed pts lap band in 2022 and has gained 50 lbs since. Needs 20 lb wt loss prior to surgery. Rec decreasing soda and takeout intake. Discussed choosing lean protein options and portion control for carbohydrates as pt just recently became pre diabetic (A1c 5.7). Discussed meal plan, portion control and weight reduction tips. Encouraged 3 small meals and 2 snacks per day. Encouraged 64 oz of water per day as tolerated.  Encouraged to track oral intake via food journal. Encouraged to begin exercise regimen as tolerated. Will monitor patient's weight, labs and nutritional status as he moves through our program.    NUTRITION Dx: Overweight/obesity related to excessive energy intake as evidenced by Body mass index is 57.03 kg/m?.  Status: New    INTERVENTIONS:  1. 1800 calorie meal plan     HANDOUTS:  1. Meal plan  2. Food exchanges  3. Balanced plate  4. Healthy snacks  5. Grocery list  6. Meal planning/prepping  7. Weight loss tips  8. Current physical activity guidelines  9. Protein supplements    GOALS:  1. 64 ounces of water per day or more; eliminate sweetened beverages  2. 150 minutes moderate/vigorous physical activity per week  3. Increase meal frequency to 5-6 small meals per day  4. Chew food well to puree consistency taking 30 minutes per meal  5. Track food intake using food journal or digital app      [place] Branch. Larance Ratledge, RD    A total of 30 minutes was spent face to face with the patient in consultation.

## 2022-08-06 NOTE — Progress Notes
BARIATRIC NEW PATIENT EVAL    REASON FOR CONSULTATION: The patient is seen in consultation for evaluation of medical problems caused by, or seriously aggravated by obesity.  In particular, I was asked to evaluate whether the patient?s health problems could be addressed adequately through more aggressive medical intervention, whether the problems were so severe that consideration of surgical alternatives should not be entertained, or whether surgical treatment might be an appropriate alternative.  The patient expresses interest in being considered for weight loss surgery at this time.     HISTORY OF PRESENT ILLNESS: The patient is a 47 y.o. male who presents today to begin the process of comprehensive evaluation for bariatric and metabolic surgery.  The patient reports a long history of obesity. Patient is at his heaviest adult weight, and first started diet at age 27. He has tried diets in the past without long term success. However, the patient gained weight and more. He is interested in surgery to improve his comorbidities as well as his overall quality of life.    In clinic today measurements were Ht:5' 5.5'' (1.664 m) Wt:(!) 348 lb (157.9 kg) ZOX:WRUE surface area is 2.7 meters squared.Marland Kitchen BMI is Body mass index is 57.03 kg/m?Marland Kitchen. Ideal body weight is Weight in (lb) to have BMI = 25: 152.2.  The excess body weight interferes with activities of daily living and negatively impacts quality of life.     He had Lap band removal a year ago with me. He gained about 50 pounds since then.     The patient c/o heartburns and is taking PPI. He had EGD last year.       The patient denies Hx of smoking, drinking and illicit drug use.    Past abdominal surgical Hx: Lap band placement and removal     The patient denies depression, anxiety and SI.       MEDICAL CONDITIONS:  Patient Active Problem List   Diagnosis    Central serous chorioretinopathy of left eye    History of laparoscopic adjustable gastric banding    Obesity Retinopathy    GERD (gastroesophageal reflux disease)        Past Medical History:   Diagnosis Date    Empyema (HCC/RAF)     Obesity        MEDICATIONS:    Current Outpatient Medications:     omeprazole 40 mg DR capsule, Take 1 capsule (40 mg total) by mouth daily., Disp: 60 capsule, Rfl: 3    PAST MEDICAL HISTORY:   Past Medical History:   Diagnosis Date    Empyema (HCC/RAF)     Obesity        PAST SURGICAL HISTORY:   Past Surgical History:   Procedure Laterality Date    HIP FRACTURE SURGERY Right     LAPAROSCOPIC GASTRIC BANDING      LUNG SURGERY         FAMILY HISTORY:   Family History   Problem Relation Age of Onset    Lymphoma Father     Obesity Father     Diabetes type II Maternal Grandfather     Parkinsonism Paternal Grandfather     Osteoporosis Paternal Aunt     Obesity Paternal Uncle     Anesthesia problems Neg Hx     Malignant hypertension Neg Hx     Malignant hyperthermia Neg Hx     Hypotension Neg Hx     Pseudochol deficiency Neg Hx        SOCIAL HISTORY:  Social History     Socioeconomic History    Marital status: Married   Tobacco Use    Smoking status: Never    Smokeless tobacco: Never   Substance and Sexual Activity    Alcohol use: Yes     Comment: one drink/week    Drug use: No    Sexual activity: Yes     Partners: Female   Other Topics Concern    Do you exercise at least a day, 3 or more days a week? No         REVIEW OF SYSTEMS:  A comprehensive 14-point review of systems were reviewed, pertinent positives and negatives listed in the HPI, all other systems were reviewed and are negative.     PHYSICAL EXAMINATION:     Ht:5' 5.5'' (1.664 m) Wt:(!) 348 lb (157.9 kg) ZOX:WRUE surface area is 2.7 meters squared.    BP 121/87  ~ Pulse (!) 100  ~ Temp 36.2 ?C (97.1 ?F) (Forehead)  ~ Ht 5' 5.5'' (1.664 m)  ~ Wt (!) 348 lb (157.9 kg)  ~ BMI 57.03 kg/m?     GENERAL: Morbidly obese individual in no acute distress.  NEURO: Alert and oriented x3, mood and affect appropriate.  HEENT: EOMs grossly normal. NECK: trachea was in midline.  SKIN: Smooth and dry.  ABDOMEN: Central obesity.  MUSCULOSKELETAL: Grossly normal range of motion.  EXTREMITIES: No gross edema  LUNGS: Clear to auscultation.  CARDIO: Regular rate and rhythm.      ASSESSMENT AND PLAN:    Russell Wiggins is a pleasant 47 y.o. year old male who presents to clinic today for evaluation for possible Laparoscopic Gastric Bypass.  The patient has a history of morbid obesity and other comorbidities listed above. I believe that the bariatric procedure will help the patient lose a significant amount of weight and improve the comorbidities. The patient's current Body mass index is 57.03 kg/m?., with co-morbidities GERD, making him a candidate. The patient would like to have the surgery as soon as possible after completion of this documentation. To further clear the patient medically/surgically the patient will need:     Routine labs to include CMP, CBC, TSH, Thiamine, Vitamin D levels, as well as A1c and Lipid panel - to be completed with PCP  EKG - to be completed with PCP  Nutrition consult  Psychology consult  [place] preoperative education class  Patient will need to be evaluated by PCP for a preoperative risk assessment - This should be completed 3-6 months prior to surgery date  I advsied him to lost 20 pounds before surgery because of the central obesity he has  During the lap band surgery, we found that he had some metal at the GE junction. This may make the bypass surgery more difficult.      We will have the patient for follow up if interested in surgical management. The patient will see Dr. Imogene Burn for pre-op evaluation. This visit can be in-person clinic.     In the interim, we discussed low calorie diet and exercise to help to lose the weight. Patient was encouraged to call the office with any questions or concerns in the meantime. Patient demonstrates understanding without barriers to education, and was agreeable with the plan of care. Thank you for allowing me to participate in the care of this patient. Should you have any questions, please do not hesitate to contact our team.      Camie Patience MD   Associate Professor  Section of Minimally Invasive and Bariatric Surgery  Division of General Surgery  [employer] [place]  Pager [phone]

## 2022-08-11 ENCOUNTER — Ambulatory Visit: Payer: BLUE CROSS/BLUE SHIELD

## 2022-08-14 ENCOUNTER — Ambulatory Visit: Payer: BLUE CROSS/BLUE SHIELD

## 2022-09-02 ENCOUNTER — Ambulatory Visit: Payer: BLUE CROSS/BLUE SHIELD

## 2022-09-16 ENCOUNTER — Ambulatory Visit: Payer: BLUE CROSS/BLUE SHIELD

## 2022-09-16 ENCOUNTER — Telehealth: Payer: BLUE CROSS/BLUE SHIELD

## 2022-09-16 DIAGNOSIS — M25562 Pain in left knee: Secondary | ICD-10-CM

## 2022-09-16 NOTE — Progress Notes
Patient Consent to Telehealth Questionnaire       05/02/2018     2:18 PM   [facility] TELEHEALTH PRECHECKIN QUESTIONS   By clicking ''I Agree'', I consent to the below:  I Agree     - I agree  to be treated via a video visit and acknowledge that I may be liable for any relevant copays or coinsurance depending on my insurance plan.  - I understand that this video visit is offered for my convenience and I am able to cancel and reschedule for an in-person appointment if I desire.  - I also acknowledge that sensitive medical information may be discussed during this video visit appointment and that it is my responsibility to locate myself in a location that ensures privacy to my own level of comfort.  - I also acknowledge that I should not be participating in a video visit in a way that could cause danger to myself or to those around me (such as driving or walking).  If my provider is concerned about my safety, I understand that they have the right to terminate the visit.     PATIENT: Russell Wiggins   MRN: [id number]   DOB: 02/01/1976   DATE OF SERVICE: 09/16/2022   PHONE: [phone]     PRIMARY CARE PROVIDER: Marcelene Butte, MD  Subjective:       History:  Russell Wiggins is a 47 y.o.    European  White male who presents for a televideo encounter setup due to COVID-19 restrictions for pain of L knee for about 2 weeks after walking, no hx of trauma, does not recall if he twisted it, wants to see ortho.    Objective:     General appearance: alert, well appearing, and in no distress.     Assessment/Plan:   Diagnoses and all orders for this visit:    Acute pain of left knee  -     Referral to Orthopaedic Surgery        Followup: PRN  Instructed to call or be seen again should symptoms worsen or fail to improve.     The above diagnosis, orders, and follow-up were discussed with the patient. The patient had all questions answered satisfactorily and understands this recommended plan of care.      Author:  Marcelene Butte 09/16/2022 2:34 PM

## 2022-09-30 ENCOUNTER — Telehealth: Payer: BLUE CROSS/BLUE SHIELD

## 2022-09-30 ENCOUNTER — Ambulatory Visit: Payer: BLUE CROSS/BLUE SHIELD

## 2022-09-30 DIAGNOSIS — Z713 Dietary counseling and surveillance: Secondary | ICD-10-CM

## 2022-09-30 NOTE — Progress Notes
[facility]  PRE-OPERATIVE TELEMEDICINE PROGRESS NOTE     Patient Consent to Telehealth Questionnaire       05/02/2018     2:18 PM   [facility] TELEHEALTH PRECHECKIN QUESTIONS   By clicking ''I Agree'', I consent to the below:  I Agree       DATE OF SERVICE: 09/30/2022    VISIT TYPE: final RD visit     Today, I consulted with the patient for a follow up visit via telemedicine; he is currently enrolled in the [facility] Bariatric Surgery program; he presented with the following measurements:    There were no vitals filed for this visit.  There is no height or weight on file to calculate BMI.       Patient weight not recorded    24-HOUR RECALL OF FOOD INTAKE:     TIME INTAKE RD COMMENTS   Breakfast  Apple slices, protein shake, chicken satay from [place] (left overs)    Snack Trail mix     Lunch [employer] - chicken, broccoli, pasta     Snack  Protein shake     Dinner  Shredded chicken w/ bbq sauce on a roll, cauliflower and cucumbers     Snack                  ESTIMATED CALORIE INTAKE: 1800 kcals     BEVERAGE/FLUID CONSUMPTION: 32-48 oz    SUPPLEMENTS: n/a    VISIT NOTE: Final RD visit. No updated weight. Discussed checking weight when possible as Dr. Imogene Burn recommending 20 lb wt loss prior to surgery. Overall doing well. In order to prepare for bariatric surgery, pt is trying to be more mindful of his diet and the overall lifestyle adjustments needed. Such as having small frequent meals, increasing protein intake, increasing water, and decreasing soda. Discussed when pt sees PCP for med clearance, to discuss referral for physical therapy as pt w/ injured knee and hip and limited mobility. Pt has seen the necessary pre-op video for surgery and we discussed any nutrition related questions pt had. Pt verbalized understanding of nutrition education provided and no barriers to learning appear to be present. Anticipate moderate compliance.     Encouraged small frequent meals, every 3-4 hours. Encouraged 64oz of water per day. Encouraged to exercise 4x/week for 30-60 minutes of moderate exercise. Will continue to monitor his weight, labs and nutritional status per protocol.    NUTRITION Dx: Overweight/obesity secondary to excess kcal intake as evidenced by BMI 57.03.  Status: Ongoing     INTERVENTION:  1. 1800 kcal meal plan     GOALS:  1. Track oral intake via food journal    2. 64oz of water per day   3. Small frequent meals, every 3-4 hours   4. Exercise 4x/week for 30-60 minutes of moderate exercise    A total of 15 minutes was spent via the [employer] secured platform [employer] Video Visit with the patient in consultation.     This patient was seen in under the guidelines of the [facility] in coordination with Dr. Camie Patience.    Tonny Branch. Kischa Altice, RD

## 2022-10-01 ENCOUNTER — Ambulatory Visit: Payer: BLUE CROSS/BLUE SHIELD

## 2022-10-10 MED ORDER — OMEPRAZOLE 40 MG PO CPDR
40 mg | ORAL_CAPSULE | Freq: Every day | ORAL | 2 refills | Status: AC
Start: 2022-10-10 — End: ?

## 2022-10-23 IMAGING — CT CT ABD-PELV W/ CM
2 of 5 series · 17 of 46 positions shown, 19 images · IV contrast (APPLIED)
Comparison: 12/26/2010

CLINICAL DATA: LEFT lower quadrant abdominal pain for 4-5 days,
question diverticulitis

EXAM:
CT ABDOMEN AND PELVIS WITH CONTRAST
TECHNIQUE: Multidetector CT imaging of the abdomen and pelvis was performed
using the standard protocol following bolus administration of
intravenous contrast. Sagittal and coronal MPR images reconstructed
from axial data set.
CONTRAST:  75mL OMNIPAQUE IOHEXOL 350 MG/ML SOLN IV. No oral
contrast.

[Series 2: abd pel w · axial · 0.76mm/px · z∈[+962,+1388]mm · 14 of 97 slices shown, 16 images]
[im 6/97  soft-tissue]
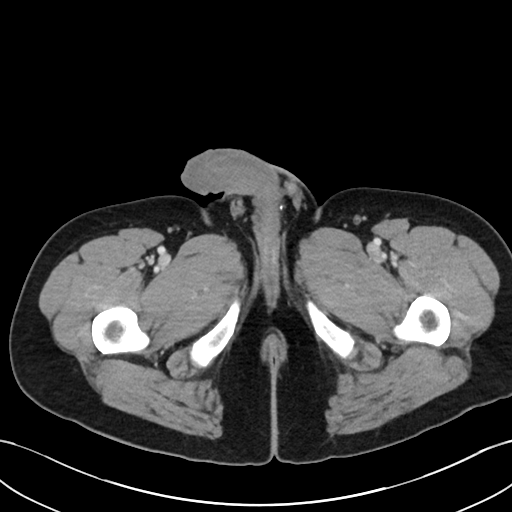
[im 6/97  bone]
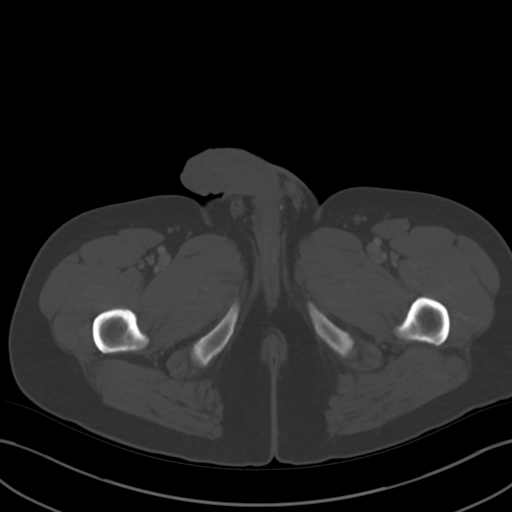
[im 11/97  soft-tissue]
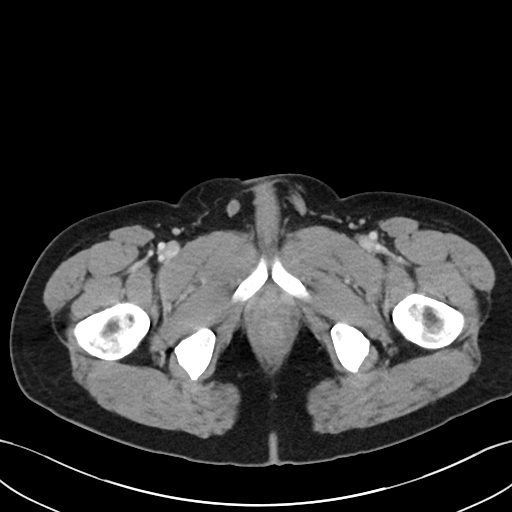
[im 21/97  soft-tissue]
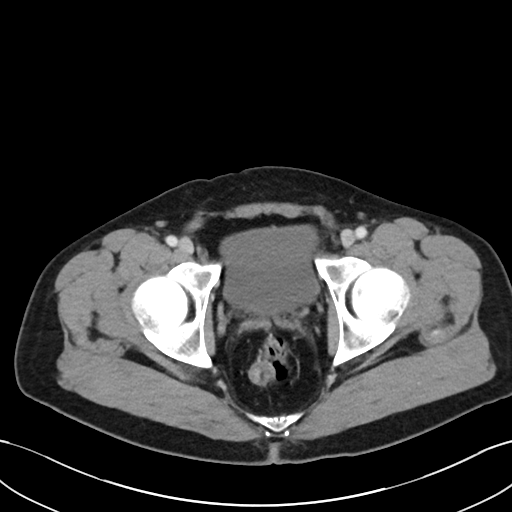
[im 26/97  soft-tissue]
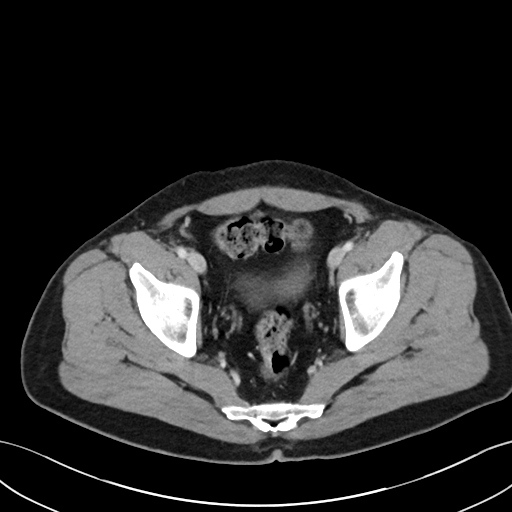
[im 31/97  soft-tissue]
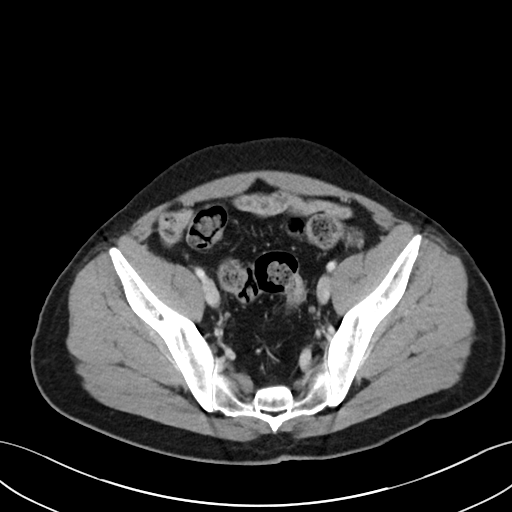
[im 41/97  soft-tissue]
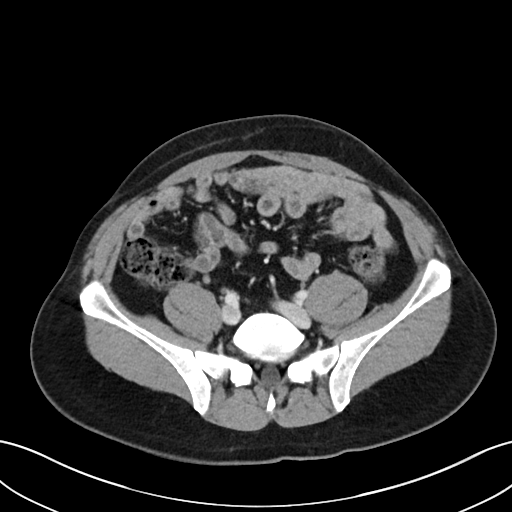
[im 46/97  soft-tissue]
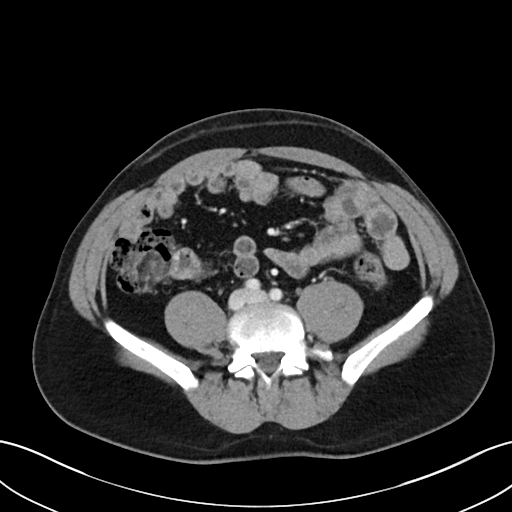
[im 51/97  soft-tissue]
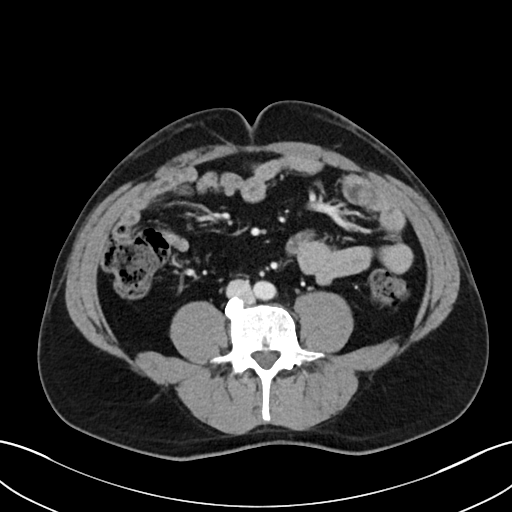
[im 56/97  soft-tissue]
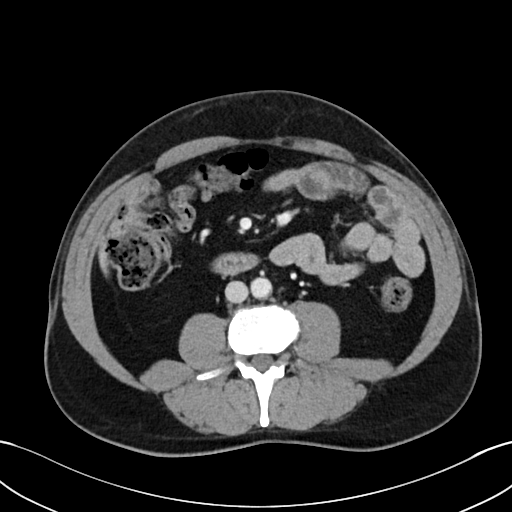
[im 56/97  bone]
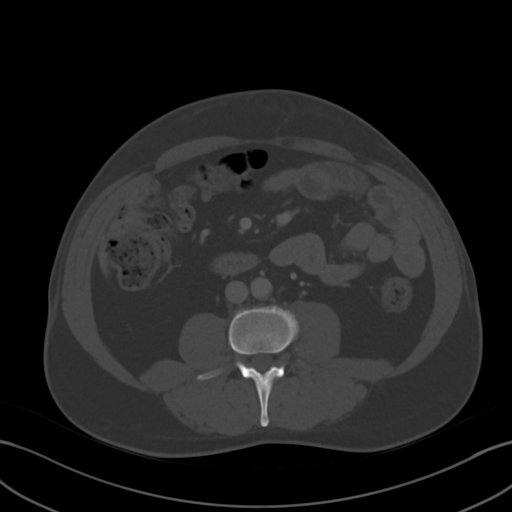
[im 66/97  soft-tissue]
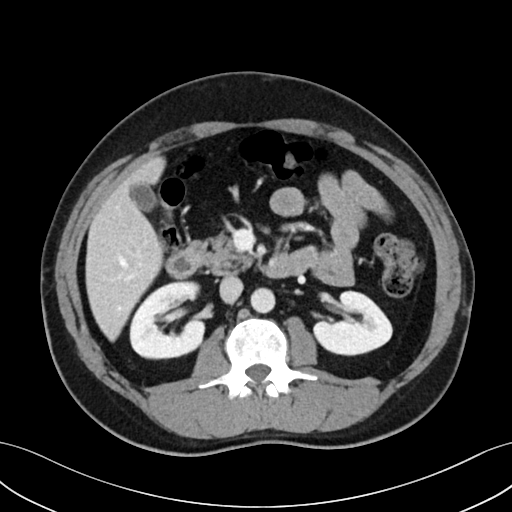
[im 71/97  soft-tissue]
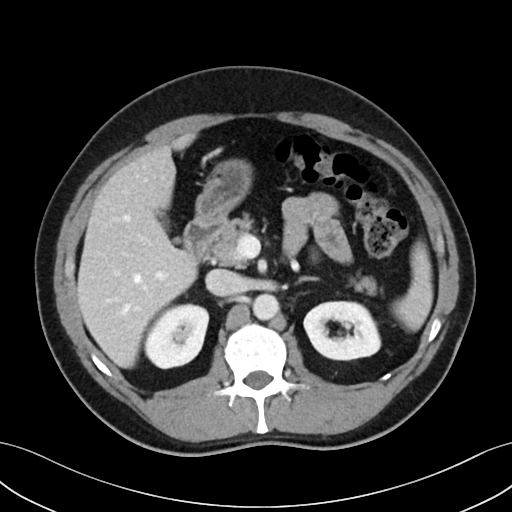
[im 76/97  soft-tissue]
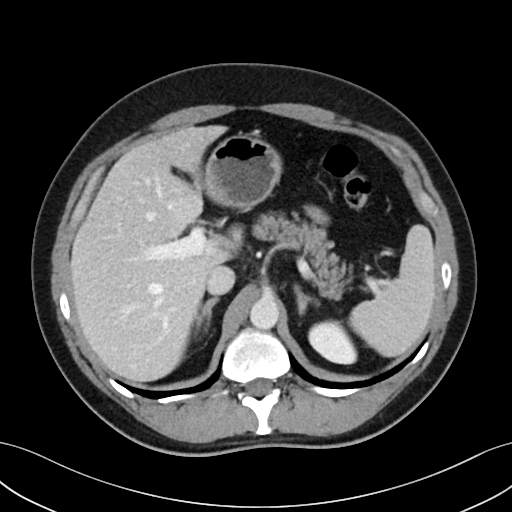
[im 86/97  soft-tissue]
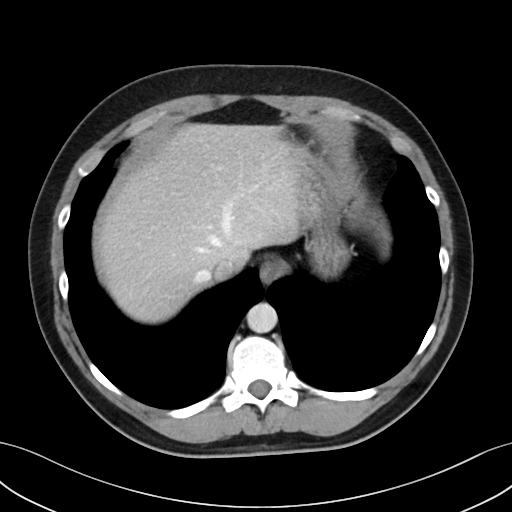
[im 91/97  soft-tissue]
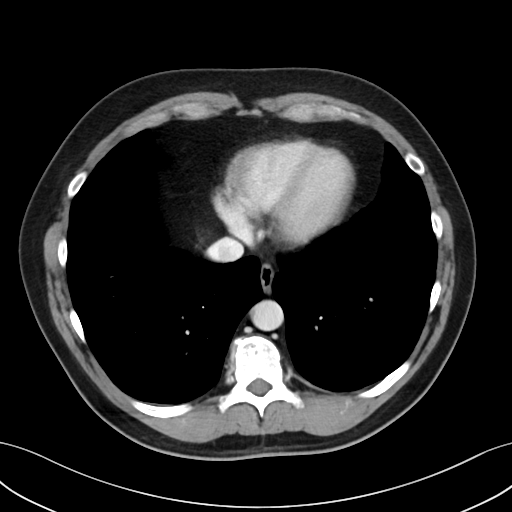

[Series 5: coronal · coronal · 0.79mm/px · 3 of 98 slices shown]
[im 33/98  soft-tissue]
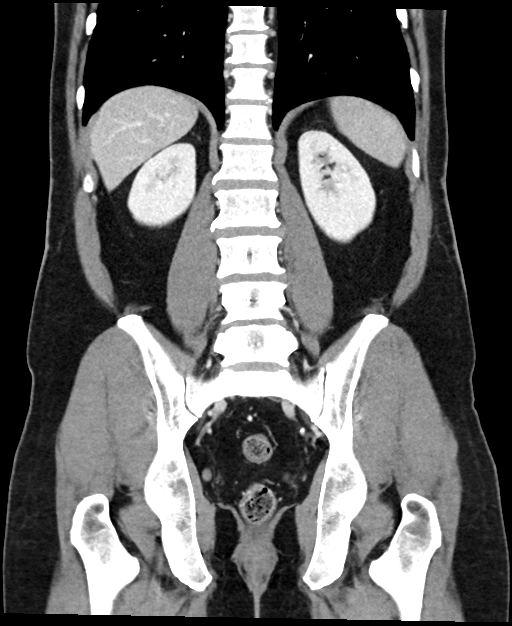
[im 44/98  soft-tissue]
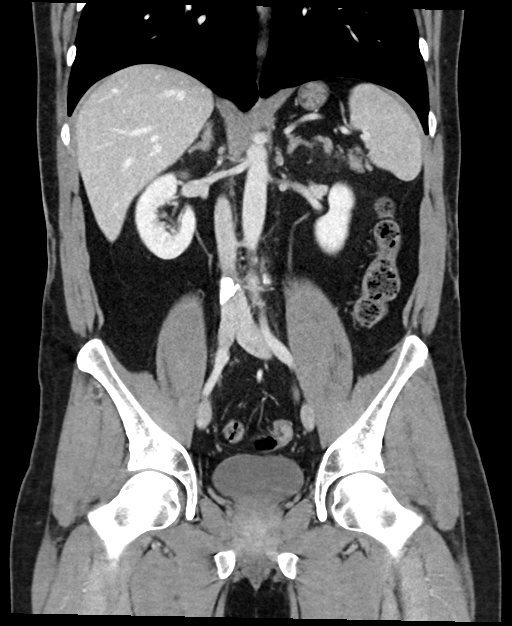
[im 54/98  soft-tissue]
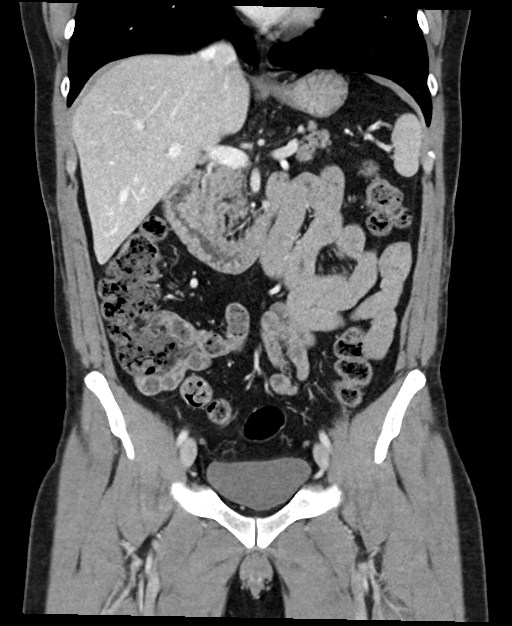

[17 of 46 positions shown; findings below may reference images not displayed]

FINDINGS: Lower chest: Lung bases clear.

Hepatobiliary: Gallbladder and liver normal appearance.

Pancreas: Normal appearance

Spleen: Normal appearance

Adrenals/Urinary Tract: Adrenal glands, kidneys, ureters, and
bladder normal appearance

Stomach/Bowel: Normal appendix. Stomach and bowel loops
unremarkable. No colonic diverticulosis or diverticulitis.

Vascular/Lymphatic: Vascular structures patent.  No adenopathy.

Reproductive: Unremarkable prostate gland and seminal vesicles

Other: No free air or free fluid. No hernia or inflammatory process.

Musculoskeletal: Unremarkable
IMPRESSION: Normal exam.

## 2022-10-27 ENCOUNTER — Institutional Professional Consult (permissible substitution): Payer: BLUE CROSS/BLUE SHIELD

## 2022-10-27 ENCOUNTER — Ambulatory Visit: Payer: BLUE CROSS/BLUE SHIELD

## 2022-10-27 DIAGNOSIS — Z01818 Encounter for other preprocedural examination: Secondary | ICD-10-CM

## 2022-10-27 NOTE — Progress Notes
PATIENT: Russell Wiggins  MRN: [id number]  DOB: Jul 28, 1975  DATE OF SERVICE: 10/27/2022    PRIMARY CARE PROVIDER: Marcelene Butte, MD    CHIEF COMPLAINT: Pre-op Exam    Subjective:      Russell Wiggins is a 47 y.o. male who presents for pre-op exam.     Procedure to be performed: Gastric bypass.    Preoperative diagnosis: Morbid obesity.    Surgery is scheduled with Dr. Camie Patience.    Surgery date: TBD.      Patient denies any previous issues with anesthesia, recovery, or other postoperative complications or healing delay.  Patient denies any concerns about going into surgery.  Denies chest pain, dyspnea, claudication, lower extremity edema, untreated obstructive sleep apnea, recent upper respiratory symptoms, bleeding disorders, blood thinner use.  Patient has moderate or greater functional capacity (>4 METs).      Past Medical History:   Diagnosis Date    Empyema (HCC/RAF)     Obesity      Past Surgical History:   Procedure Laterality Date    HIP FRACTURE SURGERY Right     LAPAROSCOPIC GASTRIC BANDING      LUNG SURGERY       Family History   Problem Relation Age of Onset    Lymphoma Father     Obesity Father     Diabetes type II Maternal Grandfather     Parkinsonism Paternal Grandfather     Osteoporosis Paternal Aunt     Obesity Paternal Uncle     Anesthesia problems Neg Hx     Malignant hypertension Neg Hx     Malignant hyperthermia Neg Hx     Hypotension Neg Hx     Pseudochol deficiency Neg Hx      Social History     Socioeconomic History    Marital status: Married   Tobacco Use    Smoking status: Never    Smokeless tobacco: Never   Substance and Sexual Activity    Alcohol use: Yes     Comment: one drink/week    Drug use: No    Sexual activity: Yes     Partners: Female   Other Topics Concern    Do you exercise at least a day, 3 or more days a week? No     Outpatient Medications Prior to Visit   Medication Sig    omeprazole 40 mg DR capsule Take 1 capsule (40 mg total) by mouth daily.     No facility-administered medications prior to visit.     No Known Allergies    Review of Systems:   Constitutional: No fevers, chills, nausea, vomiting  Head:  No head injury, facial trauma  Eyes:  No acute visual changes, exudate, trauma  Ears:  No acute hearing loss, earaches  Nose: No nasal discharge, epistaxis  Neck: No new hoarseness, voice change, or new masses  Lungs:  No hemoptysis, pleurisy  Heart:  No chest pressure with exertion, palpitations  Abdomen: No new masses, no bright red blood per rectum  Extremities: No acute pain while ambulating, no new lesions  Skin: No new changes in skin color, no rashes or lesions  Neurologic: No new motor or sensory changes     Objective:        Vitals: BP 126/86  ~ Pulse (!) 101  ~ Resp 17  ~ Ht 5' 5'' (1.651 m)  ~ Wt (!) 338 lb (153.3 kg)  ~ SpO2 98%  ~ BMI 56.25 kg/m?  General Appearance:  Alert, cooperative, no distress, appears stated age  Head:  Normocephalic, atraumatic  Eyes:  PERRL, conjunctiva/corneas clear, EOM's intact bilaterally  Ears:  Normal  external ear canals, both ears  Nose: Nares normal, mucosa normal, no drainage   Throat: Lips, mucosa, and tongue normal  Neck: Supple, no adenopathy  Lungs:  Clear to auscultation bilaterally, respirations unlabored  Heart:  Regular rate and rhythm  Abdomen:  Soft, non-tender, no masses  Extremities:Extremities normal, atraumatic, no cyanosis or edema  MSK:  No gait disturbance  Skin: Skin color, texture, turgor normal, no rashes or lesions  Neurologic: Non Focal     EKG: Sinus rhythm, stable from prior comparison, no acute ST changes       Assessment & Plan:          Russell Wiggins is a 47 y.o. male who presents for pre-op exam.     Diagnoses and all orders for this visit:    Preop examination  -     CBC & Auto Differential; Future  -     Comprehensive Metabolic Panel; Future  -     Hgb A1c; Future  -     Lipid Panel; Future  -     TSH with reflex FT4, FT3; Future  -     Vitamin D 25-OH; COMMON, deficiency status; Future  -     Vitamin B1 (Thiamine),Wh Blood; Future    Morbid obesity (HCC/RAF)  -     CBC & Auto Differential; Future  -     Comprehensive Metabolic Panel; Future  -     Hgb A1c; Future  -     Lipid Panel; Future  -     TSH with reflex FT4, FT3; Future  -     Vitamin D 25-OH; COMMON, deficiency status; Future  -     Vitamin B1 (Thiamine),Wh Blood; Future      -Patient history was reviewed today.     -Chart reviewed and testing ordered as indicated.   -Fasting labs ordered as noted above.  -Further recommendations pending results of lab tests.       There are no Patient Instructions on file for this visit.      No follow-ups on file.       The above plan of care, diagnosis, orders, and follow-up were discussed with the patient.  Questions related to this recommended plan of care were answered.  Patient verbalized understanding and agreeable with plan.            Author:  Wayland Denis. Fuller Song, MD 10/27/2022 3:09 PM

## 2022-11-04 ENCOUNTER — Ambulatory Visit: Payer: BLUE CROSS/BLUE SHIELD

## 2022-11-05 ENCOUNTER — Ambulatory Visit: Payer: BLUE CROSS/BLUE SHIELD

## 2022-11-06 ENCOUNTER — Ambulatory Visit: Payer: BLUE CROSS/BLUE SHIELD

## 2022-11-08 ENCOUNTER — Ambulatory Visit: Payer: BLUE CROSS/BLUE SHIELD

## 2022-11-24 ENCOUNTER — Ambulatory Visit: Payer: BLUE CROSS/BLUE SHIELD

## 2022-11-24 NOTE — Consults
BARIATRIC PREOPERATIVE HISTORY AND PHYSICAL EXAMINATION  11/24/2022    CHIEF COMPLAINT: This patient has been referred for consideration of bariatric surgery in the treatment of chronic morbid obesity and the associated co-morbidities.    HISTORY OF PRESENT ILLNESS: The patient is a 47 y.o. male who has been morbidly obese for a long time. Measurements in clinic today were Ht:5' 6'' (1.676 m) Wt:(!) 341 lb (154.7 kg) YQI:HKVQ surface area is 2.68 meters squared.Marland Kitchen BMI is Body mass index is 55.04 kg/m?Marland Kitchen. Ideal body weight is Weight in (lb) to have BMI = 25: 154.6.  The excess body weight interferes with activities of daily living and negatively impacts quality of life.     The clinic visit today is based on the patient's request for consideration of possible surgical intervention due to failure of medical management.     Evaluation demonstrated no endocrine or other causes for the obesity and indicated the patient was a satisfactory candidate for surgical intervention.      MEDICAL CONDITIONS:  Patient Active Problem List   Diagnosis    Central serous chorioretinopathy of left eye    History of laparoscopic adjustable gastric banding    Obesity    Retinopathy    GERD (gastroesophageal reflux disease)        MEDICATIONS:    Current Outpatient Medications:     omeprazole 40 mg DR capsule, Take 1 capsule (40 mg total) by mouth daily. (Patient not taking: Reported on 11/24/2022), Disp: 60 capsule, Rfl: 3    ALLERGIES:  No Known Allergies    PAST MEDICAL HISTORY:   Past Medical History:   Diagnosis Date    Empyema (HCC/RAF)     Obesity        PAST SURGICAL HISTORY:   Past Surgical History:   Procedure Laterality Date    HIP FRACTURE SURGERY Right     LAPAROSCOPIC GASTRIC BANDING      LUNG SURGERY         FAMILY HISTORY:   Family History   Problem Relation Age of Onset    Lymphoma Father     Obesity Father     Diabetes type II Maternal Grandfather     Parkinsonism Paternal Grandfather     Osteoporosis Paternal Aunt Obesity Paternal Uncle     Anesthesia problems Neg Hx     Malignant hypertension Neg Hx     Malignant hyperthermia Neg Hx     Hypotension Neg Hx     Pseudochol deficiency Neg Hx        SOCIAL HISTORY:   Social History     Socioeconomic History    Marital status: Married   Tobacco Use    Smoking status: Never    Smokeless tobacco: Never   Substance and Sexual Activity    Alcohol use: Yes     Comment: one drink/week    Drug use: No    Sexual activity: Yes     Partners: Female   Other Topics Concern    Do you exercise at least a day, 3 or more days a week? No       REVIEW OF SYSTEMS:  A complete review of systems was reviewed, pertinent positives and negatives listed in the HPI, all other systems were reviewed and are negative.       PHYSICAL EXAMINATION:     Ht:5' 6'' (1.676 m) Wt:(!) 341 lb (154.7 kg) QVZ:DGLO surface area is 2.68 meters squared. Body mass index is 55.04 kg/m?Marland Kitchen  BP 144/83  ~ Pulse 99  ~ Temp 36.9 ?C (98.4 ?F) (Forehead)  ~ Ht 5' 6'' (1.676 m)  ~ Wt (!) 341 lb (154.7 kg)  ~ BMI 55.04 kg/m?     GENERAL: Well nourished, well groomed in no acute distress.  NEURO: Alert and oriented x3, mood and affect appropriate.  HEENT: Pupils equal. EOMs grossly normal.   NECK: Supple, thyroid was not enlarged, trachea was in midline.  LYMPHATICS: No palpable supraclavicular, submandibular, or cervical lymphadenopathy.  LUNGS: Clear to auscultation.  CARDIO: Regular rate and rhythm.  SKIN: Smooth and dry.  ABDOMEN: Central obesity. Bowel sounds normal. Soft, non-tender, no masses. No hepatomegaly, no splenomegaly.  MUSCULOSKELETAL: Grossly normal range of motion.  EXTREMITIES: Palpable pedal pulses. No venous stasis changes.      ASSESSMENT AND PLAN:  Ryna Scarry is a 48 y.o. male with a longstanding history of morbid obesity, presents to the clinic today having successfully completed all of the items on the [facility] COMET preoperative checklist and meeting surgical criteria including- BMI of 40 or greater; Passed a psychological evaluation, Completed preoperative class, All other treatable medical diseases have been ruled out as a possible cause for obesity. Adrenal, pituitary, or thyroid screening tests have been completed and are normal, No active substance abuse/addiction, and No untreated cardiopulmonary disease.  In addition, patient's pre-op studies and tests were reviewed.    We discussed the risks, benefits, and alternatives of surgical intervention. We discussed the surgical procedure of Roux-en-Y gastric bypass possibly open, as well as discussing risks including but not limited to leak, obstruction, bleeding, and sepsis. Any of these could require further surgery. Other risks include DVT, PE, pneumonia,  hernia, wound infection, the need for dilatations of his gastrojejunostomy, the inability to lose appropriate weight and keep it off, and even death. The patient verbalized a global understanding of the surgical procedure and a desire to proceed with surgical intervention as well as accept blood and blood products in case they become necessary. The patient was agreeable with the stated surgical procedure despite its risks and informed consent was obtained after addressing all questions and concerns. We instructed the patient to start clear liquids 1 day prior to surgery, and avoid taking ASA and NSAIDs for 10 days prior to surgery. Patient to hold all diabetic medications the morning of surgery, and any diuretics.     The patient will be scheduled for Laparoscopic Gastric Bypass, possible open.   In the interim, the patient encouraged to increase exercise to 30-90 minutes of moderate/vigorous activity for 5 times a week, as well as providing further dietary recommendations. We will plan on seeing the patient in the pre-op holding area on the day of surgery. Pt demonstrates understanding, is amenable to this plan, and there are no barriers to education today.        Camie Patience MD   Associate Professor  Section of Minimally Invasive and Bariatric Surgery  Division of General Surgery  [facility]  Pager [phone]

## 2022-11-25 ENCOUNTER — Telehealth: Payer: BLUE CROSS/BLUE SHIELD

## 2022-11-25 NOTE — Telephone Encounter
Left message for patient to contact office to schedule surgical date.

## 2022-12-03 ENCOUNTER — Institutional Professional Consult (permissible substitution): Payer: BLUE CROSS/BLUE SHIELD

## 2022-12-03 DIAGNOSIS — Z23 Encounter for immunization: Secondary | ICD-10-CM

## 2022-12-03 NOTE — Nursing Note
Covid Vaccine Documentation    Russell Wiggins  1975-10-22  [id number]    Per [facility] patient is due for Influenza and Covid immunization.     Patient's Reason for vaccine  for personal request only    Fluvarix (43 months of age and older) immunization given.  Allergies reviewed:Yes  VIS provided Yes    Patient tolerated the procedure well. Patient was observed, no immediate adverse reactions noted.   Instructed patient on possible side effects and advised to monitor for any adverse reactions.   Advised to call the office for any non- life threatening allergic reaction(s) or unusual symptoms arise.   Patient verbalize understanding.       Sharyn Blitz  [facility]  Primary Care and Immediate Care  Phone: [phone]  Fax: [phone]  12/03/2022 2:14 PM

## 2022-12-08 ENCOUNTER — Ambulatory Visit: Payer: BLUE CROSS/BLUE SHIELD

## 2022-12-08 ENCOUNTER — Telehealth: Payer: BLUE CROSS/BLUE SHIELD

## 2022-12-08 NOTE — Telephone Encounter
Left voicemail and sent chart message for patient to contact office to schedule surgical date.

## 2022-12-11 ENCOUNTER — Inpatient Hospital Stay: Payer: BLUE CROSS/BLUE SHIELD

## 2022-12-11 ENCOUNTER — Ambulatory Visit: Payer: BLUE CROSS/BLUE SHIELD

## 2022-12-25 ENCOUNTER — Ambulatory Visit: Payer: BLUE CROSS/BLUE SHIELD

## 2023-01-22 ENCOUNTER — Ambulatory Visit: Payer: BLUE CROSS/BLUE SHIELD

## 2023-01-26 ENCOUNTER — Telehealth: Payer: BLUE CROSS/BLUE SHIELD

## 2023-01-26 DIAGNOSIS — L989 Disorder of the skin and subcutaneous tissue, unspecified: Secondary | ICD-10-CM

## 2023-01-26 NOTE — Progress Notes
Patient Consent to Telehealth Questionnaire       05/02/2018     2:18 PM   [facility] TELEHEALTH PRECHECKIN QUESTIONS   By clicking ''I Agree'', I consent to the below:  I Agree     - I agree  to be treated via a video visit and acknowledge that I may be liable for any relevant copays or coinsurance depending on my insurance plan.  - I understand that this video visit is offered for my convenience and I am able to cancel and reschedule for an in-person appointment if I desire.  - I also acknowledge that sensitive medical information may be discussed during this video visit appointment and that it is my responsibility to locate myself in a location that ensures privacy to my own level of comfort.  - I also acknowledge that I should not be participating in a video visit in a way that could cause danger to myself or to those around me (such as driving or walking).  If my provider is concerned about my safety, I understand that they have the right to terminate the visit.     PATIENT: Russell Wiggins   MRN: [id number]   DOB: 1975-06-17   DATE OF SERVICE: 01/26/2023   PHONE: [phone]     PRIMARY CARE PROVIDER: Marcelene Butte, MD  Subjective:       History:  Russell Wiggins is a 47 y.o.    European  White male who presents for a televideo encounter setup due to COVID-19 restrictions for derm referral renewal.   I need to renew my referral to my dermatologist, Dr. Kendall Flack in [place]. What's the best way to do that? Do you need me to come in?     Objective:     General appearance: alert, well appearing, and in no distress.     Assessment/Plan:   Diagnoses and all orders for this visit:    Skin lesion  -     Referral to Dermatology        Followup: PRN  Instructed to call or be seen again should symptoms worsen or fail to improve.     The above diagnosis, orders, and follow-up were discussed with the patient. The patient had all questions answered satisfactorily and understands this recommended plan of care.      Author: Marcelene Butte 01/26/2023 9:32 AM

## 2023-02-15 ENCOUNTER — Other Ambulatory Visit: Payer: BLUE CROSS/BLUE SHIELD

## 2023-02-17 ENCOUNTER — Ambulatory Visit: Payer: BLUE CROSS/BLUE SHIELD

## 2023-02-17 MED ADMIN — PROPOFOL 200 MG/20ML IV EMUL: INTRAVENOUS | @ 23:00:00 | Stop: 2023-02-18 | NDC 63323026929

## 2023-02-17 MED ADMIN — MIDAZOLAM HCL 10 MG/10ML IJ SOLN: INTRAVENOUS | @ 22:00:00 | Stop: 2023-02-18 | NDC 00409258705

## 2023-02-17 MED ADMIN — ROCURONIUM BROMIDE 50 MG/5ML IV SOLN: INTRAVENOUS | @ 23:00:00 | Stop: 2023-02-18 | NDC 39822420002

## 2023-02-17 MED ADMIN — VASOPRESSIN 20 UNIT/ML IV SOLN: INTRAVENOUS | Stop: 2023-02-18 | NDC 43598091425

## 2023-02-17 MED ADMIN — CEFOXITIN SODIUM 1 G IV SOLR: INTRAVENOUS | @ 23:00:00 | Stop: 2023-02-18 | NDC 25021010910

## 2023-02-17 MED ADMIN — ROCURONIUM BROMIDE 50 MG/5ML IV SOLN: INTRAVENOUS | Stop: 2023-02-18 | NDC 39822420002

## 2023-02-17 MED ADMIN — ESMOLOL HCL 100 MG/10ML IV SOLN: INTRAVENOUS | @ 23:00:00 | Stop: 2023-02-18 | NDC 55150019410

## 2023-02-17 MED ADMIN — SODIUM CHLORIDE 0.9 % IV SOLN: @ 22:00:00 | Stop: 2023-02-17

## 2023-02-17 MED ADMIN — VASOPRESSIN 20 UNIT/ML IV SOLN: INTRAVENOUS | @ 23:00:00 | Stop: 2023-02-18 | NDC 43598091425

## 2023-02-17 MED ADMIN — PHENYLEPHRINE HCL 10 MG/ML IV SOLN (ANES): INTRAVENOUS | @ 23:00:00 | Stop: 2023-02-18

## 2023-02-17 MED ADMIN — SUCCINYLCHOLINE CHLORIDE 20 MG/ML IJ SOLN: INTRAVENOUS | @ 23:00:00 | Stop: 2023-02-18 | NDC 00781341195

## 2023-02-17 MED ADMIN — LIDOCAINE HCL (CARDIAC) 100 MG/5ML IV SOSY: INTRAVENOUS | @ 23:00:00 | Stop: 2023-02-18 | NDC 76329339001

## 2023-02-17 MED ADMIN — HEPARIN SODIUM (PORCINE) 1000 UNIT/ML IJ SOLN: SUBCUTANEOUS | Stop: 2023-02-18 | NDC 00409272002

## 2023-02-17 MED ADMIN — SODIUM CHLORIDE 0.9 % IV SOLN: INTRAVENOUS | @ 22:00:00 | Stop: 2023-02-18 | NDC 00338004902

## 2023-02-17 NOTE — H&P
UPDATED H&P REQUIREMENT    For Ardyth Harps [facility] and Norval Gable [facility] and [facility]    WHAT IS THE STATUS OF THE PATIENT'S MOST CURRENT HISTORY AND PHYSICAL?   - There is no recent H&P <30 days.  Proceed to H&P Notes section for new H&P.     REFER TO MEDICAL STAFF POLICIES REGARDING PRE-PROCEDURE HISTORY AND PHYSICAL EXAMINATION AND UPDATED H&P REQUIREMENTS BELOW:    Ardyth Harps [facility] and [facility] and [facility] Medical Staff Policy 200 - For Patients Undergoing Procedures Requiring Moderate or Deep Sedation, General Anesthesia or Regional Anesthesia    Contents of a History and Physical Examination (H&P):    The H&P shall consist of chief complaint, history of present illness, allergies and medications, relevant social and family history, past medical history, review of systems and physical examination, and assessment and plan appropriate to the patient's age.    For Patients Undergoing Procedures Requiring Moderate or Deep Sedation, General Anesthesia or Regional Anesthesia:    1. An H&P shall be performed within 24 hours prior to the procedure by a qualified member of the medical staff or designee with appropriate privileges, except as noted in item 2 below.    2. If a complete history and physical was performed within thirty (30) calendar days prior to the patient???s admission to the Medical Center for elective surgery, a member of the medical staff assumes the responsibility for the accuracy of the clinical information and will need to document in the medical record within twenty-four (24) hours of admission and prior to surgery or major invasive procedure, that they either attest that the history and physical has been reviewed and accepted, or document an update of the original history and physical relevant to the patient's current clinical status. 3. Providing an H&P for patients undergoing surgery under local anesthesia is at the discretion of the Attending Physician.     4. When a procedure is performed by a dentist, podiatrist or other practitioner who is not privileged to perform an H&P, the anesthesiologist???s assessment immediately prior to the procedure will constitute the 24 hour re-assessment.The dentist, podiatrist or other practitioner who is not privileged to perform an H&P will document the history and physical relevant to the procedure.    5. If the H&P and the written informed consent for the surgery or procedure are not recorded in the patient's medical record prior to surgery, the operation shall not be performed unless the attending physician states in writing that such a delay could lead to an adverse event or irreversible damage to the patient.    6. The above requirements shall not preclude the rendering of emergency medical or surgical care to a patient in dire circumstances.

## 2023-02-17 NOTE — H&P
General Surgery Pre-Op H&P    PATIENT:  Russell Wiggins  MRN:  [id number]  DOB:  05-03-1975  DATE OF SERVICE:  02/17/2023    Assessment/Plan:    The patient is a 48 y.o. male with history of morbid obesity who is here for laparoscopic roux-en-Y gastric bypass. This was previously discussed with the patient and all questions were answered at the last encounter.     - OR today for above. Admit to inpatient post-operatively.  - Consent obtained in clinic. Risks, benefits, and alternatives discussed.     SUBJECTIVE:    Feels fair.    Past Medical History:   Diagnosis Date    Central serous chorioretinopathy of eye, left     Empyema (HCC/RAF)     Morbid obesity (HCC/RAF)     Obesity     S/P right hip fracture 1994       Past Surgical History:   Procedure Laterality Date    COLONOSCOPY  09/26/2020    HIP FRACTURE SURGERY Right 1994    w/Right Tibular Bone Graft    LAPAROSCOPIC CHOLECYSTECTOMY  2005    LAPAROSCOPIC GASTRIC BANDING      LUNG SURGERY      Right Total Hip Arthroplasty w/Removal of Hardware Right 12/17/2015    UPPER GASTROINTESTINAL ENDOSCOPY  09/26/2020    Upper GI Endoscopy w/Biopsy  01/09/2022       Prior to Admission medications    Not on File       No Known Allergies    Social History     Socioeconomic History    Marital status: Married   Tobacco Use    Smoking status: Never    Smokeless tobacco: Never   Substance and Sexual Activity    Alcohol use: Yes     Comment: one drink/week    Drug use: No    Sexual activity: Yes     Partners: Female   Other Topics Concern    Do you exercise at least a day, 3 or more days a week? No       Family History   Problem Relation Age of Onset    Lymphoma Father     Obesity Father     Diabetes type II Maternal Grandfather     Parkinsonism Paternal Grandfather     Osteoporosis Paternal Aunt     Obesity Paternal Uncle     Anesthesia problems Neg Hx     Malignant hypertension Neg Hx     Malignant hyperthermia Neg Hx     Hypotension Neg Hx     Pseudochol deficiency Neg Hx      Review of Systems  A 14 point review of systems was negative.      Objective:  NAD, Awake and alert.   RRR  No inc WOB on RA  Abd soft, NTND.    Star Age  General Surgery PGY-7

## 2023-02-17 NOTE — Consults
[facility] DEPARTMENT OF MEDICINE   INPATIENT NOTE TEMPLATE   DIVISION OF CLINICAL NUTRITION   BARIATRIC SURGERY NOTE    Patient: Russell Wiggins  MRN: [id number]  DOB: [birth year]  Admission Date: 02/17/2023    Date of Service: 02/18/2023   Requesting Physician: Russell Patience, MD  Reason for Consultation: post bariatric medical management    The following history was generated by an interview with the patient as well as a review of medical records obtained in [facility] and Epic Care Everywhere.    History of Present Illness:   Russell Wiggins is a 48 y.o. male PMH class 3 obesity, prediabetes, presenting for Roux en-Y Gastric Bypass      History of lap band s/p removal 03/14/21 by Dr. Imogene Wiggins. Was briefly in [facility] here.    Currently pod 1:  --mild abdominal pain, non-radiating  --no nausea/vomiting, constipation, diarrhea  --no home medications    [x]  ambulating without difficulty  [x]  tolerating sips  [x]  on IVF for fluid supplementation        I reviewed these specialist notes that directly relate to the patient's acute and/or chronic medical problems with documentation of the salient findings, if any:      Last Surgery Note           02/17/23 1741 Op Note signed by Russell Patience, MD              Medications:   (click to expand/collapse)   Prior to admission:   No medications prior to admission.       Scheduled: acetaminophen, 1,000 mg, Oral, TID  heparin, 5,000 Units, Subcutaneous, Q8H  lidocaine, 2 patch, Transdermal, Q24H  multivitamin, 1 tablet, Oral, Daily  pantoprazole, 40 mg, IV Push, Q24H    Infusions:    lactated ringers 125 mL/hr (02/18/23 0202)       Diet/tube feeds:   Diets/Supplements/Feeds   Diet    Sugar-free Clear Liquid (Bariatric)     Start Date/Time: 02/18/23 0710      Number of Occurrences: Until Specified       PRN: HYDROmorphone, ondansetron injection/IVPB, oxyCODONE **OR** oxyCODONE       Physical Exam:   Temp:  [36.2 ?C (97.2 ?F)-37 ?C (98.6 ?F)] 36.5 ?C (97.7 ?F)  Heart Rate:  [89-108] 91  Resp:  [12-19] 18  BP: (134-162)/(81-110) 156/110  NBP Mean:  [99-125] 125  SpO2:  [93 %-98 %] 93 %  Temp (24hrs), Avg:36.6 ?C (97.9 ?F), Min:36.2 ?C (97.2 ?F), Max:37 ?C (98.6 ?F)    Body mass index is 55.15 kg/m?Marland Kitchen  Ideal body weight: 63.8 kg (140 lb 10.5 oz)  Adjusted ideal body weight: 100.3 kg (221 lb 1.2 oz)  Wt Readings from Last 20 Encounters:   02/17/23 (!) 155 kg (341 lb 11.4 oz)   11/24/22 (!) 154.7 kg (341 lb)   10/27/22 (!) 153.3 kg (338 lb)   08/06/22 (!) 157.9 kg (348 lb)   06/24/22 (!) 157.4 kg (347 lb)   01/09/22 (!) 146.8 kg (323 lb 10.2 oz)   12/24/21 (!) 145.5 kg (320 lb 12.8 oz)   12/03/21 (!) 144.7 kg (319 lb)   11/12/21 (!) 143.9 kg (317 lb 3.2 oz)   11/05/21 (!) 143.9 kg (317 lb 3.2 oz)   10/22/21 (!) 143.2 kg (315 lb 12.8 oz)   10/15/21 (!) 143.5 kg (316 lb 6.4 oz)   10/09/21 (!) 145.1 kg (319 lb 12.8 oz)   10/08/21 (!) 144.6 kg (318 lb 12.8 oz)  10/07/21 (!) 145 kg (319 lb 9.6 oz)   10/01/21 (!) 146.2 kg (322 lb 6.4 oz)   03/14/21 (!) 136.4 kg (300 lb 11.3 oz)   10/29/20 (!) 136.1 kg (300 lb)   10/10/20 (!) 136.1 kg (300 lb)   09/26/20 (!) 135.6 kg (298 lb 15.1 oz)     Intake/Output:  I/O last 2 completed shifts:  In: 2151.3 [I.V.:2031.3; Other:120]  Out: 1000 [Urine:1000]  System Check if normal Positive or additional negative findings   Constit  [x]  General appearance  NAD   Eyes  []  Conj/Lids []  Pupils  []  Fundi     HENMT  [x]  Head    [x]  External ears/nose   [x]  Gross Hearing    []  Nasal mucosa   []  Lips/teeth/gums    []  Oropharynx    []  Mucus membranes      Neck  []  Inspection/palpation    []  Thyroid     Resp  []  Effort             []  Wheezing    []  Auscultation  []  Crackles                              []  Rhonchi     CV  []  Rhythm/rate   []  No murmur   []  No edema   []  JVP non-elevated    Normal pulses:   []  Radial []  Femoral  []  Pedal     Breast  []  Inspection []  Palpation     GI  []  No abd masses    []  No tenderness   []  No rebound/guarding   []  Liver/spleen []  Rectal     GU  M: []  Scrotum []  Penis []  Prostate   F:  []  External []  Internal     Lymph  []  Neck []  Axillae []  Groin     MSK Specify site examined:    []  Inspect/palp []  ROM   []  Stability []  Strength/tone         Skin  []  Inspection []  Palpation   []  No rash     Neuro  []  CN2-12 intact grossly   [x]  Alert and oriented   []  DTR      []  Muscle strength      []  Sensation   []  Gait/balance     Psych  [x]  Insight/judgement     []  Mood/affect    []  Gross cognition        Laboratory Data/Studies:   Available data and images were reviewed personally. Significant results and findings are addressed here or in the Assessment and Plan.  (click to expand/collapse)   Recent Labs     02/18/23  0320   WBC 15.81*   HGB 14.7   HCT 44.1   MCV 93.6   PLT 185     Recent Labs     02/18/23  0320   NA 138   K 4.6   CL 103   CO2 23   BUN 12   CREAT 1.04   GLUCOSE 113*   CALCIUM 8.6   MG 1.7   PHOS 3.0     No results for input(s): ''CHOL'', ''CHOLHDL'', ''TRIGLY'', ''CHOLDLCAL'', ''CHOLDLQ'' in the last 72 hours.  Lab Results   Component Value Date    HGBA1C 5.6 11/09/2022    HGBA1C 5.7 (H) 06/24/2022    HGBA1C 5.4 11/20/2020     No results found for: ''GLUCOSEPOC''  Micronutrients:   Lab Results   Component Value Date    VITAMINB1 131 11/09/2022    VITD25OH 19 (L) 11/09/2022    INR 1.1 12/06/2015          Imaging/Procedures:   (click to expand/collapse)            Assessment and Plan   Russell Wiggins is a 48 y.o. male PMH class 3 obesity, prediabetes, admitted for Roux en-Y Gastric Bypass    Problem List:   #Class 3 obesity s/p lap RNY gastric bypass 02/17/23  #Prediabetes    Recommendations:   --Start bariatric MV on discharge  --PPI at least 12 weeks  --Outpatient micronutrient monitoring per below  --Pain management and anticoagulation per primary team    [x]  Discussed bariatric program with the patient. Patient understands that strict adherence to a regimented postoperative diet after surgery is necessary to achieve and maintain weight loss. Patient understands that lifelong vitamin supplementation is necessary to prevent potential side effects.   [x]  Reviewed dietary considerations after bariatric surgery including need for vitamin supplementation, protein/sugar/fat goals, gradual advancement from liquids to solids over 6 weeks and expected weight loss.    Post-Bariatric Laboratory Guidelines   1 mo 3 mo 6 mo 12 mo 18 mo 2 yrs Annually   CBC X X X X X X X   CMP X X X X X X X   Prealbumin   X* X* X* X* X*   A1c Depending on pre-op A1c and risk factors   Iron panel, ferritin  X X X  X X   Folate  X* X X X* X X   B12, MMA, homocysteine (1)  X* X X X* X X   25-OH Vit D   X X X* X X   Calcium +/- PTH   X X X* X X   Zinc (2)   X* X*  X* X*   Ceruloplasmin, copper   X* X*  X* X*   Vitamin A    X  X* X*   Thiamin (3)   X* X* X* X* X*   Vit E Screen if symptomatic   Vit K Screen if symptomatic   Bone mineral density    X*  X* X*     *If pt had RYGB, BPD, or BPD/DS    1. Meds that can cause B12 deficiency: nitrous oxide, neomycin, metformin, colchicine, PPI, seizure meds  2. Zinc: also send if pt having sx of anemia but negative w/up for iron-deficiency or chronic diarrhea  3. Consider routine screening (q3-6 mo) in: women, Africans/African Americans, GI symptoms (n/v, constipation), heart failure (on lasix), SIBO, excessive alcohol use, malnutrition    Adapted from SOARD 2016 and Endocrine Society 2010 - Post-Bariatric Guidelines        Thank you for this consultation. Please page [phone] with any questions.    Author:  Chaney Wiggins 02/18/2023 8:03 AM

## 2023-02-18 LAB — CBC: NUCLEATED RBC%, AUTOMATED: 0 % (ref 13.5–17.1)

## 2023-02-18 LAB — Magnesium: MAGNESIUM: 1.7 meq/L (ref 1.4–1.9)

## 2023-02-18 LAB — Basic Metabolic Panel
ESTIMATED GFR 2021 CKD-EPI: 88 mL/min/{1.73_m2} (ref 0.60–1.30)
ESTIMATED GFR 2021 CKD-EPI: 88 mL/min/{1.73_m2} (ref 7–22)

## 2023-02-18 LAB — Phosphorus: PHOSPHORUS: 3 mg/dL (ref 2.3–4.4)

## 2023-02-18 MED ADMIN — MAGNESIUM SULFATE 4 GM/100ML IV SOLN: 4 g | INTRAVENOUS | @ 14:00:00 | Stop: 2023-02-18 | NDC 70121172001

## 2023-02-18 MED ADMIN — LACTATED RINGERS IV SOLN: 125 mL/h | INTRAVENOUS | @ 02:00:00 | Stop: 2023-02-18 | NDC 00338011704

## 2023-02-18 MED ADMIN — BUPIVACAINE HCL (PF) 0.25 % IJ SOLN: @ 01:00:00 | Stop: 2023-02-18 | NDC 00409115902

## 2023-02-18 MED ADMIN — SUGAMMADEX SODIUM 200 MG/2ML IV SOLN: INTRAVENOUS | @ 01:00:00 | Stop: 2023-02-18 | NDC 00006542312

## 2023-02-18 MED ADMIN — ACETAMINOPHEN 10 MG/ML IV SOLN: INTRAVENOUS | @ 01:00:00 | Stop: 2023-02-18 | NDC 00264410090

## 2023-02-18 MED ADMIN — PANTOPRAZOLE SODIUM 40 MG IV SOLR: 40 mg | INTRAVENOUS | @ 07:00:00 | Stop: 2023-02-19 | NDC 71288060010

## 2023-02-18 MED ADMIN — ACETAMINOPHEN 500 MG PO TABS: 1000 mg | ORAL | @ 07:00:00 | Stop: 2023-02-19 | NDC 50580045711

## 2023-02-18 MED ADMIN — LACTATED RINGERS IV SOLN: 125 mL/h | INTRAVENOUS | @ 10:00:00 | Stop: 2023-02-18 | NDC 00338011704

## 2023-02-18 MED ADMIN — HYDROMORPHONE HCL 2 MG/ML IJ SOLN: INTRAVENOUS | @ 01:00:00 | Stop: 2023-02-18 | NDC 00641615125

## 2023-02-18 MED ADMIN — CEFOXITIN SODIUM 1 G IV SOLR: INTRAVENOUS | @ 01:00:00 | Stop: 2023-02-18 | NDC 25021010910

## 2023-02-18 MED ADMIN — HYDROMORPHONE 10 MG/50 ML PCA SYRINGE (503B)(MULTI GPI): 50 mL | INTRAVENOUS | @ 02:00:00 | Stop: 2023-02-18 | NDC 73177010405

## 2023-02-18 MED ADMIN — DROPERIDOL 2.5 MG/ML IJ SOLN: INTRAVENOUS | @ 01:00:00 | Stop: 2023-02-18 | NDC 00517970225

## 2023-02-18 MED ADMIN — ACETAMINOPHEN 500 MG PO TABS: 1000 mg | ORAL | @ 16:00:00 | Stop: 2023-02-19 | NDC 50580045711

## 2023-02-18 MED ADMIN — DEXAMETHASONE SODIUM PHOSPHATE 4 MG/ML IJ SOLN: INTRAVENOUS | @ 01:00:00 | Stop: 2023-02-18 | NDC 67457042312

## 2023-02-18 MED ADMIN — ONDANSETRON HCL 4 MG/2ML IJ SOLN: INTRAVENOUS | @ 01:00:00 | Stop: 2023-02-18 | NDC 60505613005

## 2023-02-18 MED ADMIN — DEXAMETHASONE SODIUM PHOSPHATE 4 MG/ML IJ SOLN: INTRAVENOUS | @ 17:00:00 | Stop: 2023-02-18 | NDC 67457042312

## 2023-02-18 MED ADMIN — METHYLENE BLUE 0.5 % IV SOLN: @ 01:00:00 | Stop: 2023-02-18 | NDC 00517037405

## 2023-02-18 MED ADMIN — ROCURONIUM BROMIDE 50 MG/5ML IV SOLN: INTRAVENOUS | Stop: 2023-02-18 | NDC 39822420002

## 2023-02-18 MED ADMIN — FAMOTIDINE (PF) 20 MG/2ML IV SOLN: INTRAVENOUS | @ 01:00:00 | Stop: 2023-02-18 | NDC 67457043322

## 2023-02-18 MED ADMIN — LIDOCAINE 5 % EX PTCH: 2 | TRANSDERMAL | @ 07:00:00 | Stop: 2023-02-19

## 2023-02-18 MED ADMIN — MULTI-VITAMINS PO TABS: 1 | ORAL | @ 16:00:00 | Stop: 2023-02-19 | NDC 00904053961

## 2023-02-18 MED ADMIN — HEPARIN SODIUM (PORCINE) 5000 UNIT/ML IJ SOLN: 5000 [IU] | SUBCUTANEOUS | @ 13:00:00 | Stop: 2023-02-19 | NDC 00409272330

## 2023-02-18 MED ADMIN — HEPARIN SODIUM (PORCINE) 5000 UNIT/ML IJ SOLN: 5000 [IU] | SUBCUTANEOUS | @ 21:00:00 | Stop: 2023-02-19 | NDC 00409272330

## 2023-02-18 NOTE — Op Note
PRE-OP  DIAGNOSES:  1) Morbid obesity (BMI 55)  2) Sp Lap band placement and removal     POST-OP DIAGNOSIS:  1) Morbid obesity (BMI 55)  2) Sp Lap band placement and removal     PROCEDURES PERFORMED:  Laparoscopic Roux-en-Y gastric bypass    DATE of the PROCEDURE:  02/17/2023    SURGEON:  Camie Patience, MD    ASSISTANT:  Star Age, MD      ANESTHESIA: General    ESTIMATED BLOOD LOSS:  20 ml.    INDICATIONS FOR SURGERY:  The patient is a 48 y.o. year old male who has struggled with morbid obesity along with the obesity-related co-morbidities listed above. The patient has tried multiple attempts at non-surgical means of weight loss treatments including dieting and exercise without success. The patient wishes to undergo surgery to lose weight and to sustain it long-term in order to resolve/improve his obesity-related co-morbidities.    FINDINGS: Some adhesions in the upper abdomen       DESCRIPTION OF PROCEDURE:   The patient was brought to the Operating Theater and placed supine on the operating table. Sequential compression stockings were placed. Subcutaneous heparin was administered in the pretreatment unit prior to arrival in the Operating Room. An endotracheal anesthesia was then induced. IV antibiotics were administered within 1 hour of incision time and written to be discontinued within 24 hours. A Bair Hugger device was used to maintain normothermia throughout the case.    The abdomen was prepped and draped in the standard surgical fashion. An Optiview trocar was used to enter the peritoneal space under videoscopic guidance above and lateral to the umbilicus. After this had been accomplished the abdomen was insufflated to a level of 15 mmHg with carbon dioxide.    Additional trocars were placed, two in the right subcostal space for working ports for the surgeon, one in the subxiphoid space to act as a liver retractor and a fifth and final trocar in the left subcostal region for the assistant's hand. Once this had been accomplished a quick inspection of the abdomen revealed some adhesions. At this point the liver retractor was then placed exposing the esophageal hiatus. The adhesions from prior band was taken down. The anesthesia team then advanced a Silastic tube into the stomach under videoscopic guidance.  The lesser sac was entered through peri-gastric approach. Once this had been accomplished sequential firings of an Echelon Blue loads (first load 45 mm in lenght and 60 mm for the rest) was undertaken from the point of entrance into the lesser sac to the angle of His to create the proximal pouch. At the Angle of His, Blue load was used because the tissue was very thick.     Once the proximal pouch had been completely separated from the distal gastric remnant, attention was turned to creation of the gastro- jejunostomy. The ligament of Treitz was identified and measured distally approximately 125 cm. The small bowel was divided with Echelon 3000 White load. The distal jejunum was brought up in an antecoloic fashion as a loop and an end-to-side anastamosis was created between the proximal gastric pouch and the jejunum with a Echelon 3000 Blue stapler (30 mm in length). The limb reached the proximal pouch without tension and without difficulty. The anesthesiology team advanced their orogastric tube under videoscopic guidance into the alimentary limb of the jejunum through the pouch under videoscopic guidance to assure adequate diameter of the anastamosis. Two 00 Vicryl sutures were used to close the remaining  defect. Once this had been accomplished the tube was drawn back into the pouch and left in position for a later methylene blue test.    Attention was turned to creating the jejunojejunostomy.  A measuring stick was used to measure down approximately 75 cm along the alimentary limb. Once this area had been identified an enterotomy was created and a second enterotomy was created in the proximal limb of the previously stapled loop of jejunum. The two of these were then laid side by side and a side-to-side functional end-to-side anastomosis was created by firing an Echelon 3000 white load between the lumens of these two segments of bowel. The remaining defect was then closed with 00 Vicryl suture.  An anti-tension suture was placed. The mesenteric defect of the jejunojejunostomy and the Peterson's space were closed with permanent sutures to prevent internal hernia.     A clamp was placed across the alimentary limb of jejunum adjacent to the gastrojejunostomy. At this point methylene blue was injected into the pouch to look for any leaks between the stomach and the jejunum. No leakage was found. The methylene blue was aspirated.    Each of the anastomoses was inspected and found to be tension-free. The abdomen was once again inspected.  Each of the trocars were removed under videoscopic guidance and there was noted to be no trocar site bleeding. At this point the carbon dioxide was allowed to escape from the supraumbilical trocar site and then each of the skin incisions was closed with an interrupted 4-0 Monocryl subcuticular stitch. Marcaine was injected into each of the incision sites for post-operative pain control. Steri-strips and sterile dressings were placed. The patient tolerated the procedure well and was discharged awake and alert to the recovery room in stable condition. All needle and sponge counts were correct at the end of the case.    ATTESTATION:  I attest I am the attending surgeon for this case and was present for all portions of it. Additionally, I attest that all appropriate SCIP measures were employed to reduce the likelihood of postoperative complication.

## 2023-02-18 NOTE — Other
Patient's Clinical Goal:   Clinical Goal(s) for the Shift: promote comfort, pain/nausea management, encourage to use IS and ambulatory, maintain stable v/s and safety  Identify possible barriers to advancing the care plan:   Stability of the patient: Moderately Stable - low risk of patient condition declining or worsening   Progression of Patient's Clinical Goal: Pt with s/p Laparoscopic Roux-en-y POD#1 is alert, oriented x4, and ambulatory with steady gait. Pt has x5 abdominal lap sites with telfa pad and tegaderm drsg, dry and intact. PCA dilaudid was discontinued. Tylenol ATC was given for pain management, pain scale 1-3/10. Oxycodone prn was offered, but pt refused. Pt started sugar free clear liquid diet with instruction 60 cc q 15 min as tolerated. Pt already reached over 400 cc of sugar free clear liquid diet without nausea. Primary team notified for it. Pt denied urinary difficulty and ambulated several times during the shift. Afebrile, v/s stable with non monitored, O2 sat 92-95% on RA. Pt encouraged to use IS. Call light within reach. Will continue to plan of care.

## 2023-02-18 NOTE — Progress Notes
(U) COLORECTAL SURGERY PROGRESS NOTE    PATIENT: Russell Wiggins  MEDICAL RECORD NUMBER: [id number]  DATE OF BIRTH: [birth year]    PROVIDER: Camie Patience, MD  DATE OF SERVICE: 02/18/2023    SUBJECTIVE     HISTORY OF PRESENT ILLNESS:  Russell Wiggins is a 48 y.o. male with a history of GERD and morbid obesity s/p laparoscopic roux-en-Y gastric bypass (on 02/17/2023) with Dr. Camie Patience.     INTERVAL EVENTS:  02/18/2023: POD1. NAEON, AFVSS. Pain well controlled overnight on dil PCA, sips of water this AM with no n/v. Ambulating, voiding. -flatus/BM.     OBJECTIVE     PHYSICAL EXAM:  Temp:  [36.2 ?C (97.2 ?F)-37 ?C (98.6 ?F)] 37 ?C (98.6 ?F)  Heart Rate:  [89-108] 89  Resp:  [12-19] 19  BP: (134-162)/(81-103) 143/87  NBP Mean:  [99-118] 103  SpO2:  [93 %-98 %] 93 %  General: Awake, alert, and oriented. NAD.    Pyschiatric : normal mood and affect.  Cardiac: non tachycardic   Lungs: Non labored breathing on room air  Gastrointestinal: Soft, nondistended, appropriately abdomen.  No rebound tenderness or guarding.  Incision: Dressings clean, dry, and intact with minimal strike through.   Musculoskeletal: Equal strength to bilateral lower extremities  Extremities:  Warm, well perfused,     CURRENT MEDICATIONS:   Scheduled Meds:   acetaminophen  1,000 mg Oral TID    heparin  5,000 Units Subcutaneous Q8H    lidocaine  2 patch Transdermal Q24H    magnesium sulfate IV  4 g Intravenous Once    multivitamin  1 tablet Oral Daily    pantoprazole  40 mg IV Push Q24H      Continuous infections:   lactated ringers 125 mL/hr (02/18/23 0202)      PRN: HYDROmorphone, ondansetron injection/IVPB, oxyCODONE **OR** oxyCODONE      LABS/MICRO:  CBC  Recent Labs     02/18/23  0320   WBC 15.81*   HGB 14.7   HCT 44.1   PLT 185     BMP  Recent Labs     02/18/23  0320   NA 138   K 4.6   CL 103   CO2 23   BUN 12   CREAT 1.04   CALCIUM 8.6   MG 1.7   PHOS 3.0     COAGS  No results for input(s): ''PT'', ''INR'' in the last 72 hours.    Invalid input(s): ''PTT''  MICRO  No results found for this or any previous visit (from the past 168 hour(s)).  IMAGING/PATHOLOGY:  No imaging has been resulted in the last 24 hours     ASSESSMENT/PLAN     Russell Wiggins is a 48 y.o. male with a history of GERD and morbid obesity s/p laparoscopic roux-en-Y gastric bypass (on 02/17/2023) with Dr. Camie Patience.     Neuro/Pysch:  # Acute post-surgical pain, well controlled  - Multimodal oral pain regimen  Dc dil PCA   Scheduled: tylenol 1000 mg TID, lidocaine patch  PRN: Oxycodone 5mg /10mg  q 4hrs PRN      CV:  - VSS, no active issues    Resp:  - Patient saturating well on room air  - Encourage IS, OOB    FEN/GI:  #H/o morbid obesity   #H/o GERD   #s/p laparoscopic roux-en-Y gastric bypass (on 02/17/2023)  - mIVF: D5LR @125  ml/hr, HLIV when PO>400cc  - Continue to replete lytes (K>4, Mg>2, Phos>3)  - Diet:  Sugar-free Clear Liquid (Bariatric) (and additional linked orders)  - Bowel regimen: none  - Antiemetics: Zofran  - PPI     GU:  - Foley: voiding   - Continue to monitor UOP, Cr      ID:  Leukocytosis expected iso post-surgical period   - no c/f active infection; continue to monitor    Endo:   Patient with no history of diabetes; transient hyperglycemia expected iso post-surgical period  - Continue to monitor    Heme:  # Acute Blood Loss Anemia  - Expected acute blood loss anemia from surgical blood loss and drainage (not a complication);  no c/f active bleeding, ruled out  - Pre-op Hg 15.3 -> post-op Hg 14.7   - Continue to monitor      Ppx:  - DVT/PE Ppx: SQH    Activity:   - Encourage OOB, ambulate TID    Dispo:  - Home when medically stable, expected discharge tomorrow if patient tolerating PO and pain controlled.   - Home needs: None  - Follow up: schedule post-op visit in two weeks    The patient was discussed with surgical attending, Dr. Camie Patience, who agrees with the assessment/plan.    Signed:  Adah Perl, MD   Colorectal Surgery, [phone]   02/18/2023 7:20 AM

## 2023-02-18 NOTE — Op Note
----------------------------------------------------------------------------------------------------------------------  (THE FOLLOWING IS FOR NURSING REFERENCE AND HAS BEEN PULLED FROM THE CURRENT CHART. PACU Phone: [phone])    Procedure(s) (LRB):  LAPAROSCOPIC ROUX-EN-Y (N/A)  Morbid obesity (HCC/RAF) [E66.01]  Treatment Team         Provider   Role Specialty Contact     Bariatric, Surgery -      Team, 1st Provider Contact --  [phone]   [phone]       Russell Patience, MD      Attending Surgery, Bariatric  [id number]   [phone]              __________________________________________________________________________________  Anesthesia Procedures    __________________________________________________________________________________    History  Past Medical History:   Diagnosis Date    Arthritis     Central serous chorioretinopathy of eye, left     Empyema (HCC/RAF)     GERD (gastroesophageal reflux disease)     Morbid obesity (HCC/RAF)     Obesity     S/P right hip fracture 1994    Sleep apnea      Past Surgical History:   Procedure Laterality Date    ABDOMINAL SURGERY      COLONOSCOPY  09/26/2020    FRACTURE SURGERY      HIP FRACTURE SURGERY Right 1994    w/Right Tibular Bone Graft    LAPAROSCOPIC CHOLECYSTECTOMY  2005    LAPAROSCOPIC GASTRIC BANDING      LUNG SURGERY      Right Total Hip Arthroplasty w/Removal of Hardware Right 12/17/2015    SKIN BIOPSY      UPPER GASTROINTESTINAL ENDOSCOPY  09/26/2020    Upper GI Endoscopy w/Biopsy  01/09/2022     __________________________________________________________________________________    Labs  No results for input(s): ''WBC'', ''HCT'', ''HGB'', ''PLT'', ''PT'', ''APTT'', ''INR'', ''NA'', ''K'', ''CL'', ''CO2'', ''BUN'', ''CREAT'', ''GLUCOSE'', ''MG'', ''PHOS'', ''ICALCOR'', ''GLUCOSEPOC'' in the last 72 hours.  __________________________________________________________________________________    Vital Signs  Last Recorded Vital Signs:    02/17/23 2030   BP: 162/101   Pulse: 97   Resp: 12   Temp:    SpO2: 94% @LASTETCO2 @  Temp Readings from Last 1 Encounters:   02/17/23 36.4 ?C (97.5 ?F) (Temporal)       Pain Information (Last Filed)       Score Location Comments Edu?      5 None None None          __________________________________________________________________________________    Intake and Output  I/O last 3 completed shifts:  In: 925 [I.V.:925]  Out: -   I/O this shift:  In: 125 [I.V.:125]  Out: -      __________________________________________________________________________________    IV Drips/Fluids/PCA:    HYDROmorphone in sodium chloride      And    sodium chloride      lactated ringers 125 mL/hr (02/17/23 1756)     __________________________________________________________________________________    Russell Wiggins, Drains, and Airways  Peripheral IV 20 G Anterior;Left;Proximal Forearm (Active)       Wound 02/17/23 Incision Bilateral Abdomen (Active)     __________________________________________________________________________________    ----------------------------------------------------------------------------------------------------------------------      PACU NursingTransfer Note  8:38 PM, 02/17/2023      Isolation? No    Antibiotics in OR:  Last Antibiotic (last 24 hours) Showing orders from other encounters      Date/Time Action Medication Dose    02/17/23 1639 Given    cefOXitin inj 3 g    02/17/23 1431 Given    cefOXitin inj 3 g  Last Antiemetic:   Med Administrations and Associated Flowsheet Values (last 4 hours) Showing orders from other encounters      Date/Time Action Medication Dose    02/17/23 1707 Given    ondansetron 4 mg/2 mL inj 4 mg          Acetaminophen given @:  Med Administrations and Associated Flowsheet Values (last 24 hours) Showing orders from other encounters      Date/Time Action Medication Dose    02/17/23 1655 Given    acetaminophen IV inj 1,000 mg          Last pain medication:   Pain Meds (last 4 hours) Showing orders from other encounters      Date/Time Action Medication Dose    02/17/23 1800 New Bag/ Syringe/ Cartridge    HYDROmorphone 10 mg in sodium chloride 50 mL PCA syringe (0.2 mg/mL) RTU     02/17/23 1716 Given    HYDROmorphone 2 mg/mL inj 0.4 mg    02/17/23 1657 Given    BUPivacaine PF 0.25% inj 60 mL    02/17/23 1657 Given    HYDROmorphone 2 mg/mL inj 1 mg    02/17/23 1655 Given    acetaminophen IV inj 1,000 mg          Txp Anti-rejection medication:  Anti-rejection meds. (last 24 hours) Showing orders from other encounters      Date/Time Action Medication Dose    02/17/23 1428 Given    dexAMETHasone 4 mg/mL inj 4 mg            Does the patient use prescription pain medication at home (prior to admission)?Marland KitchenMarland KitchenMarland KitchenNo    Pain level: Acceptable for patient? Yes    Difficult Airway? No    Respiratory is at baseline? No: 2 liters nasal cannula now    Has the patient received Flumazenil or Narcan in PACU? No  (if ''Yes'', must be at least 45 minutes prior to transfer)     Aldrete: 10    Level of Consciousness:  Awake, Alert, Oriented x four    Cardiac Rhythm?  Normal Sinus    Surgical Site: Intact and Dry or with Minimal Drainage  Lines, Drains, and Airways       Wound  Duration             Wound 02/17/23 Incision Bilateral Abdomen <1 day                   Diet: NPO    Tolerating liquids: Undetermined    Activity: MAE: Has not ambulated    Voided:  Due to Void    Significant Other Location:   no family present today, per pt     Will Transport to: Floor                       With: HA, Escort or Care Partner    Continuity of Care Issues from OR or PACU:   None

## 2023-02-18 NOTE — Consults
CASE MANAGER ASSESSMENT      Admit [id number]    Date of Initial CM Assessment: 02/18/2023    Problems: Active Problems:    * No active hospital problems. *       Past Medical History:   Diagnosis Date    Arthritis     Central serous chorioretinopathy of eye, left     Empyema (HCC/RAF)     GERD (gastroesophageal reflux disease)     Morbid obesity (HCC/RAF)     Obesity     S/P right hip fracture 1994    Sleep apnea     Past Surgical History:   Procedure Laterality Date    ABDOMINAL SURGERY      COLONOSCOPY  09/26/2020    FRACTURE SURGERY      HIP FRACTURE SURGERY Right 1994    w/Right Tibular Bone Graft    LAPAROSCOPIC CHOLECYSTECTOMY  2005    LAPAROSCOPIC GASTRIC BANDING      LUNG SURGERY      Right Total Hip Arthroplasty w/Removal of Hardware Right 12/17/2015    SKIN BIOPSY      UPPER GASTROINTESTINAL ENDOSCOPY  09/26/2020    Upper GI Endoscopy w/Biopsy  01/09/2022        Primary Care Physician:Mendoza, Kelby Fam, MD  Phone:[phone]    LANGUAGE ASSISTANCE:   Language Assistance  Language Resource Used?: Patient declined    NEEDS ASSESSMENT:   Is the patient able to participate in the CM Initial Assessment at this time?: Yes  Information Obtained From: Patient    Level of Function Prior to Admit: Self Care/Indep. W ADLs    Primary Living Situation: Lives w/Capable Spouse, Lives w/Family    Pre-admission Living Situation: Home/Apartment, 1-Story     Primary Support Systems: Spouse/significant other    Contact Name: Lium, Kulpa Phone Number: [phone]   Does the patient have a Family/Support System member participating in Discharge Planning?: Yes    DPOA?: Yes DPOA Type: Medical     Bathroom on Main Floor: Yes  Stairs in Home: 0     Prior Treatments / Services: None    Who is your PCP?: Marcelene Butte, MD    Do you have your Primary Care Doctor's office number?: Yes    How often do you visit your doctor?: Semi-annual    Do you need information/education regarding your medical condition?: No       Were you hospitalized in the last 30 days?: No    READMIT ASSESSMENT: (IF APPLICABLE)     FOLLOW UP APPT QUESTIONS: (IF APPLICABLE)     RISK STRATIFICATION: (IF APPLICABLE)     DISCHARGE ASSESSMENT:     Projected Date of Discharge: 02/19/2023    Anticipated Complex D/C?: No    Projected Discharge to: Home    Discharge Address: [address]  [place] [address]    Projected Discharge Needs: None    Support Identified at Discharge: Spouse  Name of Discharge Support Person: same as above Phone Number: same as above     Who is available to transport you upon discharge?: Family Transportation       SDOH     Within the past 12 months, has a lack of transportation kept you from medical appointments, meetings, work, or from getting things needed for daily living? : Patient declines/skip question  How hard is it for you to pay for the very basics like food, housing, medical care, and heating?: Patient declines/skip question  How hard is it for you  to pay for prescriptions or medical bills?: Patient declines/skip question  In the past 12 months has the electric, gas, oil, or water company threatened to shut off services in your home?: Patient declines/skip question  In the last 12 months, was there a time when you were not able to pay the mortgage or rent on time?: Patient declines/skip question     In the last 12 months, was there a time when you did not have a steady place to sleep or slept in a shelter (including now)?: Patient declines/skip question                            Russell Wiggins,  02/18/2023

## 2023-02-18 NOTE — Progress Notes
Pharmaceutical Services - Admission Medication Reconciliation Note      Patient Name: Russell Wiggins  Medical Record Number: [id number]  Admit date: 02/17/2023 10:21 AM    Age: 48 y.o.  Sex: male  Allergies: No Known Allergies  Height:   Most recent documented height   02/17/23 1.676 m (5' 6'')     Actual Weight:   Most recent documented weight   02/17/23 (!) 155 kg   11/24/22 (!) 154.7 kg     Diagnosis: The patient is currently admitted with the following concerns/issues: Active Problems:    * No active hospital problems. *      Reported Medication History   I spoke with the patient at bedside to update the home medication list for this hospital admission.    PTA Medication List (discrepancies are noted)   No medications prior to admission.       Discharge Prescription Preference:   [id number] [place], [place] - [address]  [address]  [place] [address]        The patient's allergies and medications have been reviewed and updated.         Wylene Simmer, 02/18/2023, 2:05 PM  Medication Reconciliation Pharmacy Technician     ___________________________________________________________________________________________________________________     I reviewed the prior to admission medication list compiled by the medication reconciliation pharmacy technician and attest to the above.     The reconciliation of admission orders with the prior to admission medication list is complete.     Please note, accuracy is based on the patient?s/family's/caregiver's ability and willingness to provide this information at the time of interview. This progress note represents our good faith effort to obtain the best possible medication history from all attainable sources at the time of reconciliation.      There are no issues requiring follow-up at this time.    Neziah Vogelgesang L Dejha King 02/18/2023 2:36 PM   Medication Reconciliation Pharmacist

## 2023-02-18 NOTE — Consults
IP CM ACTIVE DISCHARGE PLANNING  Department of Care Coordination      Admit [id number]  Anticipated Date of Discharge: 02/19/2023    Following DG:LOVF, Vivia Ewing, MD      Today's short update     Per AM update with MD covering U-Surgery, patient is s/p laparoscopic roux-en-Y gastric bypass, POD #1. Advancing diet and ongoing pain management. Plan is to dc home when medically cleared, pending needs.    Disposition     Home  Pending plan  [address]  [place] [address]  Family/Support System in agreement with the current discharge plan: Yes, in agreement and participating    Multidisciplinary Team Member Plan of Care   Interdisciplinary rounds were conducted with the multidisciplinary team including the clinical social worker and nurse case manager. The patient's plan of care and discharge plan were discussed and formulated based on the patient's specific needs.    Non-medical Transportation Arrangement Status (if applicable)     Patient/family secured     Other Arrangements (if applicable)                    Wayne Sever Julio Zappia,  02/18/2023

## 2023-02-19 LAB — Basic Metabolic Panel
CHLORIDE: 104 mmol/L (ref 96–106)
CREATININE: 0.94 mg/dL (ref 0.60–1.30)

## 2023-02-19 LAB — CBC: MCH CONCENTRATION: 33.7 g/dL (ref 31.5–35.5)

## 2023-02-19 MED ORDER — PANTOPRAZOLE SODIUM 40 MG PO TBEC
40 mg | ORAL_TABLET | Freq: Every day | ORAL | 0 refills | 30.00 days | Status: AC
Start: 2023-02-19 — End: ?
  Filled 2023-02-20 (×2): qty 30, 30d supply, fill #0

## 2023-02-19 MED ORDER — OXYCODONE HCL 5 MG PO TABS
5 mg | ORAL_TABLET | Freq: Four times a day (QID) | ORAL | 0 refills | 2.00 days | Status: AC | PRN
Start: 2023-02-19 — End: ?
  Filled 2023-02-20 (×2): qty 5, 2d supply, fill #0

## 2023-02-19 MED ORDER — ACETAMINOPHEN 500 MG PO TABS
1000 mg | ORAL_TABLET | Freq: Three times a day (TID) | ORAL | 0 refills | 7.00 days | Status: AC
Start: 2023-02-19 — End: ?
  Filled 2023-02-20 (×2): qty 42, 7d supply, fill #0

## 2023-02-19 MED ADMIN — ACETAMINOPHEN 500 MG PO TABS: 1000 mg | ORAL | @ 01:00:00 | Stop: 2023-02-19 | NDC 50580045711

## 2023-02-19 MED ADMIN — ACETAMINOPHEN 500 MG PO TABS: 1000 mg | ORAL | @ 14:00:00 | Stop: 2023-02-19

## 2023-02-19 MED ADMIN — HEPARIN SODIUM (PORCINE) 5000 UNIT/ML IJ SOLN: 5000 [IU] | SUBCUTANEOUS | @ 14:00:00 | Stop: 2023-02-19 | NDC 00409272330

## 2023-02-19 MED ADMIN — HEPARIN SODIUM (PORCINE) 5000 UNIT/ML IJ SOLN: 5000 [IU] | SUBCUTANEOUS | @ 06:00:00 | Stop: 2023-02-19 | NDC 00409272330

## 2023-02-19 MED ADMIN — PANTOPRAZOLE SODIUM 40 MG IV SOLR: 40 mg | INTRAVENOUS | @ 06:00:00 | Stop: 2023-02-19 | NDC 71288060010

## 2023-02-19 MED ADMIN — LIDOCAINE 5 % EX PTCH: 2 | TRANSDERMAL | @ 04:00:00 | Stop: 2023-02-19

## 2023-02-19 MED ADMIN — ACETAMINOPHEN 500 MG PO TABS: 1000 mg | ORAL | @ 11:00:00 | Stop: 2023-02-19

## 2023-02-19 NOTE — Nursing Note
All questions/concerns addressed. Handouts and verbal instructions given regarding follow-up appointments, wound care, activity, diet, and medication regimen. Patient understands how to contact MD if questions arise. Patient understands to call 911 or go to ER for emergencies. All belongings sent with patient. All criteria met adequate for discharge. Transported topharmacy via wheelchair with transporter.

## 2023-02-19 NOTE — Discharge Summary
COLORECTAL SURGERY DISCHARGE SUMMARY  PATIENT: Russell Wiggins  MRN: [id number]  DOB: 06-Feb-1976  ATTENDING PHYSICIAN: Dr. Camie Patience  ADMISSION DATE: 02/17/2023     DISCHARGE DATE:   02/18/2023  ADMISSION DIAGNOSIS:    Morbid obesity (HCC/RAF) [E66.01]  DISCHARGE DIAGNOSIS:   Morbid obesity (HCC/RAF) [E66.01]    * No post-op diagnosis entered *    CONSULTANTS:    Patient Care Team:  Marcelene Butte, MD as PCP - General          PROCEDURES/STUDIES PERFORMED:        No imaging has been resulted in the last 30 days      OPERATIONS DURING ADMISSION:    Procedure(s) (LRB):  LAPAROSCOPIC ROUX-EN-Y (N/A)      HPI   Kalman Louallen is a 48 y.o. male with a history of GERD and morbid obesity s/p laparoscopic roux-en-Y gastric bypass (on 02/17/2023) with Dr. Camie Patience.     HOSPITAL COURSE:   The patient was admitted on 02/17/2023 to undergo the above elective surgery, which was uneventful with no immediate complications. EBL was minimal and post-operative hemoglobin was 14.7. Patient arrived in PACU in stable condition and subsequently transferred to TRU for continuation of care. Patient tolerated appropriate diet without nausea or vomiting. Post-surgical pain was adequately controlled throughout admission.      On the morning of discharge, the patient was afebrile, hemodynamically stable, and tolerating a full liquid diet. Patient was voiding and ambulating without difficulties. Patient had return of bowel function by day of discharge.     DISCHARGE PHYSICAL EXAM   BP 146/108 Comment: RN notified ~ Pulse 92  ~ Temp 36.7 ?C (98.1 ?F) (Temporal)  ~ Resp 17  ~ Ht 1.676 m (5' 6'')  ~ Wt (!) 155 kg (341 lb 11.4 oz)  ~ SpO2 94%  ~ BMI 55.15 kg/m?     GEN: No acute distress.   NEURO: Grossly intact without any focal deficits  HEENT: NCAT, EOMI  CV: WNL, no evidence of chest pain.   PULM: Normal respiratory effort on room air.   ABD: Soft, non tender, non distended. No guarding or rebound.  GU: Voiding spontaneously INCISION: clean, dry, and intact with steri strips.   EXTREMITIES: Moves all extremities well. Warm, well perfused, no edema    DISCHARGE INSTRUCTIONS   The patient was given discharge teaching regarding medications, diet, and activity.    Disposition Patient was discharged home in stable condition with family.    Home health: None    Medications:      Medication List        START taking these medications      acetaminophen 500 mg tablet  Commonly known as: Tylenol  Take 2 tablets (1,000 mg total) by mouth three (3) times daily for 7 days.     oxyCODONE 5 mg tablet  Take 1 tablet (5 mg total) by mouth every six (6) hours as needed for Severe Pain (Pain Scale 7-10). Max Daily Amount: 20 mg     pantoprazole 40 mg DR tablet  Commonly known as: Protonix  Take 1 tablet (40 mg total) by mouth daily.               Where to Get Your Medications        These medications were sent to [facility] [phone])  [address], [place] [address]      Hours: Mon-Fri 8AM-9PM, Sat 8AM-7PM, Sun/Holidays 8AM-5PM (  Closed 1PM-2PM for lunch) Phone: [phone]   acetaminophen 500 mg tablet  oxyCODONE 5 mg tablet  pantoprazole 40 mg DR tablet         Diet: Full liquid diet for 3 weeks.     Activity:   For six weeks, avoid heavy lifting (greater than or equal to 5 pounds) and vigorous activities (jogging, running, biking). Walking, using staris, riding in a car are all acceptable activities.   Avoid submerging in water, bathing or swimming for 5 weeks. Showers only are okay.  No driving while in pain or while on narcotics.     Return precautions: The patient was provided with a list of return precautions including but not limited to:  Chills, fever exceeding 101 ?F (38.5 ?C)  Chest pain or shortness of breath  Nausea or vomiting persisting for 24 hours  Increase in the amount or change in the color of drainage from drain  Redness, Swelling, or foul-smelling drainage from incision  Discomfort not relieved by prescribed medications  Sudden, persistent increase in pain  Inability to pass urine  Constipation or excessive diarrhea which persists beyond two days    FOLLOW UP   The patient was instructed to follow-up with Thornton Park, MD in 2 week(s). Please see upcoming appointments below.?  Future Appointments   Date Time Provider Department Center   03/03/2023  1:30 PM Kermit Balo., NP [facility]       The patient was seen and examined with Dr. Imogene Burn. Plan of care discussed with attending physician who agreed with the above.     Author:    Adah Perl, MD

## 2023-02-19 NOTE — Discharge Instructions
HOME CARE INSTRUCTIONS POST BARIATRIC SURGERY  For questions or concerns: Monday - Friday, 8AM to 5 PM call [phone] [facility]).   After 5 PM weekdays, any weekend or holiday, call [phone] [facility] Page Operator and ask for the Bariatric Surgery Resident On-Call    Operation: Laparoscopic roux-en-Y gastric bypass (on 02/17/2023) with Dr. Camie Patience     WHEN TO [facility]  If any of the following occur:  Temperature of 100.4?F or greater.  Persistent, severe or increasing pain.  Persistent nausea or vomiting.  Diarrhea lasting more than 2 days.  Bleeding from the incision(s) that is difficult to control with light pressure.  Fluid or drainage from the incision(s).  Any incision that becomes warm or hot to the touch.  Redness that extends 1 inch or more around any of the incisions.  Foul odor from any of the incisions.    DIET    Continue with clear Liquid diet. Advance as per your post bariatric surgery protocol packet.     WHAT IS EXPECTED  Some pain is expected, but it should be mild and continually improving.  The incision will become firm.  This will soften over several weeks.    WHAT TO DO AT HOME   Remove the Tegaderm (clear dressings) on day 2 after your surgery. Keep steristrips. They usually fall off on their own in 5-7 days. You may start showering tomorrow after you remove the Tegaderm dressing.   Shower only and gently dry the incisions after bathing.  No tub baths, swimming or hot tubs for 4 weeks.  Take your prescription medications as instructed at the time of leaving the hospital.   Walking, using stairs, riding in a car, are all acceptable activities.  Do not lift anything heavier than 5 pounds in weight, run, bicycle, jog, or swim for a total of 6 weeks from the day of surgery.  You may resume driving when you are no longer using any narcotic pain medication, and are comfortable moving about.    FOLLOW-UP  Call the General Surgery Appointment Desk at [phone] to schedule a postoperative visit with Dr. Imogene Burn in 1-2 weeks.

## 2023-02-19 NOTE — Consults
FINAL DISCHARGE MULTIDISCIPLINARY NOTE  Department of Care Coordination      Admit ZOXW:[id number]  Anticipated Date of Discharge: 02/19/2023    Following UJ:WJXB, Vivia Ewing, MD    Home Address:[address] [facility] [place] [address]      DISCHARGE INFORMATION:     Discharge Address: [phone]  [place] [address]    Individual(s) notified of discharge plan:  Contact Name: Dubois Eichmann Relationship: Self   Contact Number(s): [phone]      Is patient/family informed of discharge?: Yes Is patient/family agreeable of discharge destination?: Yes     Support Systems: Spouse       Medicare Important Message Provided: Not Applicable  Medicare Outpatient Observation Notice (MOON) Provided: Not Applicable      Final Discharge Needs: No Needs           Homeless Discharge (if applicable):   Primary Living Situation: Lives w/Capable Spouse, Lives w/Family    Transportation Arrangements (if applicable):     Pt does not require assistance with transportation upon dc.           SDOH   Social Drivers of Health (SDOH)  Within the past 12 months, has a lack of transportation kept you from medical appointments, meetings, work, or from getting things needed for daily living? : Patient declines/skip question  How hard is it for you to pay for the very basics like food, housing, medical care, and heating?: Patient declines/skip question  How hard is it for you to pay for prescriptions or medical bills?: Patient declines/skip question  In the past 12 months has the electric, gas, oil, or water company threatened to shut off services in your home?: Patient declines/skip question  In the last 12 months, was there a time when you were not able to pay the mortgage or rent on time?: Patient declines/skip question  In the last 12 months, was there a time when you did not have a steady place to sleep or slept in a shelter (including now)?: Patient declines/skip question    Hattie Perch, BSN, RN, CCM,  02/19/2023

## 2023-03-02 ENCOUNTER — Other Ambulatory Visit: Payer: BLUE CROSS/BLUE SHIELD

## 2023-03-03 ENCOUNTER — Ambulatory Visit: Payer: BLUE CROSS/BLUE SHIELD

## 2023-03-03 DIAGNOSIS — Z9884 Bariatric surgery status: Secondary | ICD-10-CM

## 2023-03-03 DIAGNOSIS — K912 Postsurgical malabsorption, not elsewhere classified: Secondary | ICD-10-CM

## 2023-03-03 NOTE — Progress Notes
BARIATRIC SURGERY 2 WEEK POST-OP - TELEMEDICINE    Patient Consent to Telehealth Questionnaire       05/02/2018     2:18 PM   [facility] TELEHEALTH PRECHECKIN QUESTIONS   By clicking ''I Agree'', I consent to the below:  I Agree     - I agree  to be treated via a video visit and acknowledge that I may be liable for any relevant copays or coinsurance depending on my insurance plan.  - I understand that this video visit is offered for my convenience and I am able to cancel and reschedule for an in-person appointment if I desire.  - I also acknowledge that sensitive medical information may be discussed during this video visit appointment and that it is my responsibility to locate myself in a location that ensures privacy to my own level of comfort.  - I also acknowledge that I should not be participating in a video visit in a way that could cause danger to myself or to those around me (such as driving or walking).  If my provider is concerned about my safety, I understand that they have the right to terminate the visit.       CHIEF COMPLAINT:  2 weeks status-post Laparoscopic Gastric Bypass.    INTERVAL HISTORY: The patient is a 48 y.o. male who presents to telemedicine today status-post Laparoscopic Gastric Bypass. The patient is doing well in his postoperative course. He is tolerating full liquid diet without difficulty. Protein and fluid intake are adequate. He is trying to increased water intake. He has no complaints today.     Surgery weight was 341 pounds.   The patient has lost 30 lbs since surgery date.     Daily protein intake: 90 grams  Daily fluid intake: At least 50oz    Patient is taking PPIs as instructed upon hospital discharge, and compliant with Bariatric Advantage multivitamins and B complex daily.    CURRENT MEDICAL CONDITIONS:  Patient Active Problem List   Diagnosis    Central serous chorioretinopathy of left eye    History of laparoscopic adjustable gastric banding    Obesity    Retinopathy    GERD (gastroesophageal reflux disease)        Past Medical History:   Diagnosis Date    Arthritis     Central serous chorioretinopathy of eye, left     Empyema (HCC/RAF)     GERD (gastroesophageal reflux disease)     Morbid obesity (HCC/RAF)     Obesity     S/P right hip fracture 1994    Sleep apnea          Current Outpatient Medications:     oxyCODONE 5 mg tablet, Take 1 tablet (5 mg total) by mouth every six (6) hours as needed for Severe Pain (Pain Scale 7-10). Max Daily Amount: 20 mg, Disp: 5 tablet, Rfl: 0    pantoprazole 40 mg DR tablet, Take 1 tablet (40 mg total) by mouth daily., Disp: 90 tablet, Rfl: 0    PHYSICAL EXAMINATION:    Vitals:    03/03/23 1323   Weight: (!) 311 lb 12.8 oz (141.4 kg)   Height: 5' 6'' (1.676 m)         GENERAL: Well nourished, well groomed in no acute distress.  NEURO: Alert and oriented x3, mood and affect appropriate.  ABDOMEN: Incisions appear closely approximated without evidence of dehiscence, discharge, or significant erythema. Incisions appear dry.      ASSESSMENT AND PLAN:  The patient is a 48 y.o.male who presents to clinic today status-post Laparoscopic Gastric Bypass.  The patient is doing well in his postoperative course. Advised to continue to take multivitamins, and B complex as well as PPIs as instructed. Patient to continue to avoid all NSAIDs, ASA, smoking and drinking alcohol. We would like blood work to be completed prior to her office visit to check on patient's vitamin levels. The orders has been placed today.     We will plan to see the patient back in three months for his next postoperative visit.  Patient is otherwise encouraged to call the clinic with any questions, concerns in the meantime.  The patient demonstrates understanding, and is amenable to the above outlined plan, there are no barriers to education today.    The patient was seen remotely due to COVID-19 outbreak and current recommendations to avoid close contact. Patient does not have an urgent medical concern that needs to be addressed in person. CC Video Visit with the patient conducted through [employer] secured platform in consultation.       The plan of care was discussed with Dr.Yijun Imogene Burn who is in agreement with what is outlined above.      Rosalia Hammers, NP  [facility]

## 2023-03-05 ENCOUNTER — Ambulatory Visit: Payer: BLUE CROSS/BLUE SHIELD | Attending: Registered"

## 2023-03-05 DIAGNOSIS — Z9884 Bariatric surgery status: Secondary | ICD-10-CM

## 2023-03-05 NOTE — Progress Notes
[facility]  2 WEEK POST-OPERATIVE PATIENT EVALUATION    Patient Consent to Telehealth Questionnaire       05/02/2018     2:18 PM   [facility] TELEHEALTH PRECHECKIN QUESTIONS   By clicking ''I Agree'', I consent to the below:  I Agree       BARIATRIC SURGERY PERFORMED: Roux en-Y Gastric Bypass    FOLLOW-UP VISIT: 3 weeks post op    DATE OF SURGERY: 02/16/2022    ANTHROPOMETRICS:  Vitals:    03/05/23 1404   Weight: (!) 311 lb 12.8 oz (141.4 kg)   Height: 5' 6'' (1.676 m)     Body mass index is 50.33 kg/m?Marland Kitchen         Initial Weight: 341#  Total Weight Lost: 29# since sx   CBW 311.8#    MEDICATIONS:  Outpatient Medications Prior to Visit   Medication Sig    oxyCODONE 5 mg tablet Take 1 tablet (5 mg total) by mouth every six (6) hours as needed for Severe Pain (Pain Scale 7-10). Max Daily Amount: 20 mg    pantoprazole 40 mg DR tablet Take 1 tablet (40 mg total) by mouth daily.     No facility-administered medications prior to visit.       LABS:  Results for orders placed or performed in visit on 11/09/22   Hgb A1c   Result Value Ref Range    Hgb A1c 5.6 <5.7 %        Results for orders placed or performed in visit on 11/09/22   Lipid Panel   Result Value Ref Range    Cholesterol 135 See Comment mg/dL    Cholesterol,LDL,Calc 80 <100 mg/dL    Cholesterol, HDL 36 (L) >40 mg/dL    Triglycerides 97 <161 mg/dL    Non-HDL,Chol,Calc 99 <130 mg/dL       Results for orders placed or performed in visit on 11/09/22   Vitamin B1 (Thiamine),Wh Blood   Result Value Ref Range    Vitamin B1 (Thiamine),Wh Blood 131 70 - 180 nmol/L          Results for orders placed or performed in visit on 11/09/22   Comprehensive Metabolic Panel   Result Value Ref Range    Sodium 140 135 - 146 mmol/L    Potassium 4.7 3.6 - 5.3 mmol/L    Chloride 104 96 - 106 mmol/L    Total CO2 24 20 - 30 mmol/L    Anion Gap 12 8 - 19 mmol/L    Glucose 84 65 - 99 mg/dL    Creatinine 0.96 0.45 - 1.30 mg/dL    Estimated GFR >40 See GFR Additional Information mL/min/1.75m2    GFR Additional Information See Comment     Urea Nitrogen 14 7 - 22 mg/dL    Calcium 9.6 8.6 - 98.1 mg/dL    Total Protein 6.9 6.1 - 8.2 g/dL    Albumin 3.9 3.9 - 5.0 g/dL    Bilirubin,Total 0.4 0.1 - 1.2 mg/dL    Alkaline Phosphatase 58 37 - 113 U/L    Aspartate Aminotransferase 34 13 - 62 U/L    Alanine Aminotransferase 34 8 - 70 U/L          Results for orders placed or performed in visit on 11/09/22   Vitamin D 25-OH; COMMON, deficiency status   Result Value Ref Range    Vitamin D,25-Hydroxy 19 (L) 20 - 50 ng/mL  Today, I consulted with the patient for a follow up visit via telemedicine; he is currently enrolled in the [facility] Bariatric Surgery program; he presented with the following measurements:    ABNORMAL LAB VALUES: Will reassess vitamin/mineral levels at 4 month post operative f/u appointment.    SUPPLEMENTING MULTIVITAMIN AND B COMPLEX:  Yes       FLUID INTAKE: 64 fl oz.    24-HOUR RECALL OF FOOD INTAKE:   3 premier protein shakes   Enchilada soup (1/4 cup tomato soup)    ESTIMATED CALORIE INTAKE: 450kcal   ADEQUATE PROTEIN INTAKE? Yes   ESTIMATED GRAMS OF PROTEIN BASED ON INTAKE: 90 g    CURRENT PHYSICAL ACTIVITY: walking 10-15 minutes daily   BARRIERS TO PHYSICAL ACTIVITY: 2 weeks post-op      VISIT NOTES: Pt states he is doing well, continues to tolerate liquid diet. No GI issues. Meeting daily protein and liquid goals. Compliant w/ bariatric multivitamins. Discussed soft foods and foods to limit.   Encouraged to supplement with daily MVI and B-complex. Encouraged 64oz of water per day and 80-100 grams of protein as tolerated. Encouraged to walk daily for 30 minutes. Encouraged to track oral intake via food journal. Discussed transitioning to soft foods, adequate protein and hydration needs. Will continue to monitor his labs, weight and nutritional status per program protocol.     NUTRITION Dx:  Altered GI function  related to  s/p gastric bypass   as evidenced by pt required to follow bariatric diet and vitamin regimen.  .  Status: Ongoing       GOALS:  1. Protein 80-100 grams per day  2. Total fluid 64 ounces per day  3. Calories [phone] per day  4. Increase PA to 150 minutes moderate/vigorous per week 6 weeks post operatively  5. Small consistent meals every 2-3 hours  6. Chew food well to liquid consistency taking 30 minutes per meal  7. Supplement with daily MVI and B-complex vitamins     A total of 15 minutes was spent via the HIPPA secured platform [employer] Video Visit with the patient in consultation.     This patient was seen in under the guidelines of the [facility] for Obesity and Metabolic Health in coordination with Dr. Camie Patience.      Odis Luster, RD

## 2023-03-05 NOTE — Patient Instructions
Section of Bariatric Surgery Post-Operative Instructions  This is a simplified version of the required pre-operative class information. For detailed information, please refer to the PowerPoint slides.     Postoperative Diet Stages    Clear Liquid Diet- Day Before, Day of and Day after surgery  Broth  Water or ice chips  Diluted, clear 100% juice (1/4 cup juice, ? water)  Sugar-free Jell-O  Sugar-free popsicles  Propel  Gatorade ZERO  Crystal lite  Herbal Teas Full Liquid Diet - Begin Day 2 after surgery  3 protein shakes daily  4-6 oz. protein shake in each sitting (about ? shake)  Protein shakes should be:  100-200 calories  >20 g protein  <15 g carbohydrates  <10 g sugar  Whey protein isolate or vegan shakes for lactose intolerance  Goal 80-100 grams protein per day, as tolerated  Start multivitamin + B complex (or bariatric multivitamin)  Please note: you may be nauseated for the first 3-4 weeks after surgery when taking vitamins. Take it with food. If it continues, try vitamin patches (found on PatchMd.com) for the first few months.  Drink slowly- work up to 64 oz. fluids daily  Water, herbal tea, Propel, Gatorade ZERO, broth  Fluid goal does not include protein shakes     Soft Foods Diet - Day 22- Day 42  80-100 g protein, 64 oz.  fluid daily as tolerated  Continue multivitamin  Eat soft proteins first (no beef), then soft non-starchy vegetables and/or fruits  No grains (rice, noodles, bread, oatmeal, etc.)  Do NOT eat and drink at the same time  Eat and slip slowly to prevent nausea   Regular Texture Diet - Begin Day 43 after surgery  Goal of [phone] calories, 80-100 grams protein/day  64 ounces of fluid per day  No liquids 15 minutes before meals and 30 minutes afterwards  Portion control 1/2 cup food per snack and 3/4 cup of food per meal  Introduce one new food item at a time  Eat slowly, take 20-30 minutes for each meal  Stop eating at the first sign of fullness  Continue protein supplements as needed Activity  Walk frequently during the day (every 2-3 hours) and avoid prolonged in-activity to prevent blood clots.  Note: Walking can help with constipation, prevent pneumonia, and relieve post-operative gas pain.  NO lifting greater than 10 lbs. for at least 4-weeks after surgery  Wait at least 6-weeks before traveling by plane    Wound Care & Post-operative Pain  Showering is OK once home  But NO tub-baths, hot tubs, and/or swimming in pool for one-month after surgery to avoid infection.  Pain is usually related to movement. Wearing an abdominal binder can help. Take it slow for the first month. Take frequent rest.  You may take prescribed medication for pain.  Keep in mind the side effects: constipation, nausea, and itching.  If pain is moderate, please take over the counter Tylenol 500 mg, 2 capsules every 6-8 hours as necessary.  Constipation  Change in bowel habits is common and does not represent true constipation. However, constipation may develop due to initial lack of fiber in diet and high-protein intake.  If you do not have a bowel movement for more than 3 days, please take over-the-counter laxatives (i.e. Miralax, Milk of Magnesia, Magnesium citrate, etc).  It is IMPORTANT to maintain good hydration, walk frequently, and may take fiber supplements (Psyllium husk Metamucil).  Medical Leave  Generally, 2-4 weeks of leave is provided.  FMLA  Documentation/ Leave of Absence/Return to Work/EDD/SDI  Send your requests through a MyChart message to your surgeon (don't forget to include the SDI receipt number)  or  call our office [phone]  We kindly ask for your patience in the that you allow 14 business days for paperwork completion.  Please Note: All disability extension requests require an appointment with you and your bariatric clinic provider to review. Extensions will not be considered until you have completed this requirement.  Contact us  For non-urgent matters:  Send a message to your Surgeon through MyChart  Call the office directly at [phone] (M-F 7:00A-4:00P)  E-mail: [email or web address]          Puree Diet/ Soft Foods    The soft food diet consists of foods that are soft, mashed or pureed  Eat protein first, then vegetables or fruit (no grain)  This helps to transition to solid foods and minimize stomach discomfort  Do not prematurely advance to solid foods, as this can be dangerous    Soft Foods:  Low fat dairy: cottage cheese (no added fruit), [place] yogurt, ricotta cheese, other soft cheeses    Eggs: Egg Beaters, egg whites, whole eggs, boiled, scrambled, baked, poached    Legumes (mashed or blended): black beans, peas, kidney beans, garbanzo beans, white beans, lentils, vegetarian refried beans, hummus, black eyed peas, split pea soup, lentil soup, bean based chili  check protein: veggie burger, black bean burger (nothing spicy), Cameron Ali    Tofu: soft or silken tofu (nothing crispy)     Pureed meats, moist ground meats such as [place] or chicken   Prepared with broth or in vegetable soup  Slow cook chicken until tender     Soft fish: tuna, tilapia, salmon, grouper, cod, haddock, sea bass    Shellfish: scallops, lobster, shrimp, crab    Homemade egg/chicken/tuna salad   made with low fat plain [place] yogurt, olive oil, low-fat mayo     Pureed or mashed fruit, fresh or canned in water: unsweetened applesauce, banana, blackberries, blueberries, cherries, honeydew melon, kiwi, peaches, pears, raspberries, seedless grapes, seedless watermelon, strawberries  No dried fruit  Avoid seeds    Pureed, steamed/mashed vegetables:  spinach, collard greens (no ham), tomatoes, zucchini, artichokes, beets, cabbage, carrots, cauliflower, garlic, leeks, mushrooms, onion, pumpkin, spaghetti squash, mustard greens (no ham), sweet potato, yam  Pureed, strained vegetable soups are safe      Food Recommendations  Food should be soft and either mashed or pureed  Eat slowly and chew well  Add one new food at a time to test tolerance  Use moist cooking methods such as boiled, saut?ed, poached, stewed or braised  Use broth to moisten food  Avoid grains (oatmeal, grits cream of wheat, rice, bread, noodles, crackers, pasta, etc.)  Don?t bread or fry foods  Avoid spicy foods (jalapenos, chili powder, cayenne, etc.) for 3 months  Other seasonings are safe to flavor food (salt, pepper, garlic, cumin, cinnamon, etc.)  Avoid red meat for 3-6 months  Avoid caffeinated drinks such as soda, energy drinks, coffee for at least 3 months  Avoid carbonated drinks for 3 months    Sample Menu         For any questions, please contact:  [facility]  [phone]  [email or web address]  Monday - Friday 7am-4:30pm

## 2023-03-08 ENCOUNTER — Ambulatory Visit: Payer: BLUE CROSS/BLUE SHIELD | Attending: Registered"

## 2023-03-15 ENCOUNTER — Ambulatory Visit: Payer: BLUE CROSS/BLUE SHIELD | Attending: Registered"

## 2023-04-02 ENCOUNTER — Other Ambulatory Visit: Payer: BLUE CROSS/BLUE SHIELD

## 2023-04-05 ENCOUNTER — Ambulatory Visit: Payer: BLUE CROSS/BLUE SHIELD | Attending: Registered"

## 2023-04-05 ENCOUNTER — Telehealth: Payer: BLUE CROSS/BLUE SHIELD

## 2023-04-05 DIAGNOSIS — Z9884 Bariatric surgery status: Secondary | ICD-10-CM

## 2023-04-05 NOTE — Telephone Encounter
 BARIATRIC SURGERY 6-Week FOLLOW UP     Telephone visit completed for six week follow up regarding patient's bariatric surgery outcome. The patient is a 48 y.o. male who underwent a Roux-en-Y gastric bypass and laparoscopic sleeve gastrectomy on 01/29/23 with  Dr. Camie Patience    Measurements provided by patient today are:  Wt:  296.6 lbs              CURRENT POST-OPERATIVE FINDINGS:   Patient reports tolerating diet without difficulty. Patient denies any significant nausea, vomiting, dysphagia, GERD, abdominal pain or postoperative complications. He reports having one instance of eating a shrimp dish from a restaurant that was somewhat tough, which led to regurgitation of the food. He also mentions that if he doesn?t eat every 3 hours, he experiences a stomach ache and thinks this could be a new type of hunger pain following the surgery.    Patient denies any ER or urgent care visits since surgery.     Patient denies any other provider visits since surgery.     Patient denies any readmission to any hospital since surgery.     Patient denies any issues with incisions and healing well.    Patient was encouraged to call the clinic with any questions or concerns in the meantime.       Fara Olden Hoyle Sauer, NP  [place] [facility]

## 2023-04-05 NOTE — Progress Notes
 [facility]  6 WEEK POST-OPERATIVE PATIENT EVALUATION    Patient Consent to Telehealth Questionnaire       05/02/2018     2:18 PM   [facility] TELEHEALTH PRECHECKIN QUESTIONS   By clicking ''I Agree'', I consent to the below:  I Agree       BARIATRIC SURGERY PERFORMED: Roux en-Y Gastric Bypass    FOLLOW-UP VISIT: 6 weeks post-op    DATE OF SURGERY: 02/17/2023    ANTHROPOMETRICS:  Vitals:    04/05/23 0921   Weight: (!) 296 lb 9.6 oz (134.5 kg)   Height: 5' 6'' (1.676 m)     Body mass index is 47.87 kg/m?Marland Kitchen         Initial Weight: 341# (says he was 325# at home scale on day prior to sx)   Total Weight Lost: 44.4#  since sx   CBW 296.6#    Weight in (lb) to have BMI = 25: 154.6  Ideal body weight: 63.8 kg (140 lb 10.5 oz)  Adjusted ideal body weight: 92.1 kg (203 lb 0.5 oz)      MEDICAL/SURGICAL HISTORY:  Past Medical History:   Diagnosis Date    Arthritis     Central serous chorioretinopathy of eye, left     Empyema (HCC/RAF)     GERD (gastroesophageal reflux disease)     Morbid obesity (HCC/RAF)     Obesity     S/P right hip fracture 1994    Sleep apnea      Past Surgical History:   Procedure Laterality Date    ABDOMINAL SURGERY      COLONOSCOPY  09/26/2020    FRACTURE SURGERY      HIP FRACTURE SURGERY Right 1994    w/Right Tibular Bone Graft    LAPAROSCOPIC CHOLECYSTECTOMY  2005    LAPAROSCOPIC GASTRIC BANDING      LUNG SURGERY      Right Total Hip Arthroplasty w/Removal of Hardware Right 12/17/2015    SKIN BIOPSY      UPPER GASTROINTESTINAL ENDOSCOPY  09/26/2020    Upper GI Endoscopy w/Biopsy  01/09/2022       MEDICATIONS:  Outpatient Medications Prior to Visit   Medication Sig    oxyCODONE 5 mg tablet Take 1 tablet (5 mg total) by mouth every six (6) hours as needed for Severe Pain (Pain Scale 7-10). Max Daily Amount: 20 mg    pantoprazole 40 mg DR tablet Take 1 tablet (40 mg total) by mouth daily.     No facility-administered medications prior to visit.       LABS:  Results for orders placed or performed in visit on 11/09/22   Hgb A1c   Result Value Ref Range    Hgb A1c 5.6 <5.7 %        Results for orders placed or performed in visit on 11/09/22   Lipid Panel   Result Value Ref Range    Cholesterol 135 See Comment mg/dL    Cholesterol,LDL,Calc 80 <100 mg/dL    Cholesterol, HDL 36 (L) >40 mg/dL    Triglycerides 97 <161 mg/dL    Non-HDL,Chol,Calc 99 <130 mg/dL       Results for orders placed or performed in visit on 11/09/22   Vitamin B1 (Thiamine),Wh Blood   Result Value Ref Range    Vitamin B1 (Thiamine),Wh Blood 131 70 - 180 nmol/L          Results for orders placed or performed in visit on 11/09/22  Comprehensive Metabolic Panel   Result Value Ref Range    Sodium 140 135 - 146 mmol/L    Potassium 4.7 3.6 - 5.3 mmol/L    Chloride 104 96 - 106 mmol/L    Total CO2 24 20 - 30 mmol/L    Anion Gap 12 8 - 19 mmol/L    Glucose 84 65 - 99 mg/dL    Creatinine 1.61 0.96 - 1.30 mg/dL    Estimated GFR >04 See GFR Additional Information mL/min/1.44m2    GFR Additional Information See Comment     Urea Nitrogen 14 7 - 22 mg/dL    Calcium 9.6 8.6 - 54.0 mg/dL    Total Protein 6.9 6.1 - 8.2 g/dL    Albumin 3.9 3.9 - 5.0 g/dL    Bilirubin,Total 0.4 0.1 - 1.2 mg/dL    Alkaline Phosphatase 58 37 - 113 U/L    Aspartate Aminotransferase 34 13 - 62 U/L    Alanine Aminotransferase 34 8 - 70 U/L          Results for orders placed or performed in visit on 11/09/22   Vitamin D 25-OH; COMMON, deficiency status   Result Value Ref Range    Vitamin D,25-Hydroxy 19 (L) 20 - 50 ng/mL              ABNORMAL LAB VALUES: Will reassess vitamin/mineral levels at 4 month post operative f/u appointment.    CURRENT PHYSICAL ACTIVITY: walking 10 minutes a day; strengthen training 20 minutes 3x/week  BARRIERS TO PHYSICAL ACTIVITY: none    VISIT NOTES: Pt reports episodes of emesis/intolerance w/ protein (shrimp at restaurant) likely d/t inappropriate texture. Advised pt to continue to eat slowly and chew food each bite thoroughly. Avoid skipping meals and use protein shake as meal replacements until more consistent work schedule. Compliant w/ bariatric multivitamins, advised pt to verify if MVI contains iron d/t s/p RNY. Reviewed foods to continue avoiding/limiting. Pt voiced understanding.  Encouraged to supplement with daily MVI and B-complex. Encouraged 64oz of water per day and 80-100 grams of protein as tolerated. Encouraged to walk daily for 30 minutes. Encouraged to track oral intake via food journal. Discussed transitioning to soft foods, adequate protein and hydration needs. Will continue to monitor his labs, weight and nutritional status per program protocol.    24-HOUR RECALL OF FOOD INTAKE:     TIME INTAKE RD COMMENTS   Breakfast  Ground chicken and celery (boiled)    Snack Protein shake (Premier)    Lunch [employer] or [place] yogurt     Snack      Dinner  Chicken w/ guacamole     Snack                  FLUID INTAKE: 60 fl oz    ESTIMATED CALORIE INTAKE: 1200kcal   ADEQUATE PROTEIN INTAKE? Yes   ESTIMATED GRAMS OF PROTEIN BASED ON INTAKE: 80 g    NUTRITION Dx: Inadequate fluid intake secondary to pt not meeting recommended 64 oz fluid per day  as evidenced by 24 hr recall, pt report .  Status: New     HANDOUTS:  1. N/A    GOALS:  1. Protein 80-100 grams per day  2. Total fluid 64 ounces per day  3. Calories [phone] per day  4. Increase PA to 150 minutes moderate/vigorous per week 6 weeks post operatively  5. Small consistent meals every 2-3 hours  6. Chew food well to liquid consistency taking 30 minutes per  meal  7. Supplement with daily MVI and B-complex vitamins     A total of 15 minutes was spent face to face with the patient in consultation.     Odis Luster, RD

## 2023-05-31 ENCOUNTER — Ambulatory Visit: Payer: BLUE CROSS/BLUE SHIELD | Attending: Registered"

## 2023-06-02 ENCOUNTER — Other Ambulatory Visit: Payer: BLUE CROSS/BLUE SHIELD

## 2023-06-02 ENCOUNTER — Ambulatory Visit: Payer: BLUE CROSS/BLUE SHIELD

## 2023-06-02 DIAGNOSIS — Z9884 Bariatric surgery status: Secondary | ICD-10-CM

## 2023-06-02 NOTE — Progress Notes
 BARIATRIC SURGERY LONG TERM POST-OP - TELEMEDICINE    Patient Consent to Telehealth Questionnaire       05/02/2018     2:18 PM   [facility] TELEHEALTH PRECHECKIN QUESTIONS   By clicking ''I Agree'', I consent to the below:  I Agree     - I agree  to be treated via a video visit and acknowledge that I may be liable for any relevant copays or coinsurance depending on my insurance plan.  - I understand that this video visit is offered for my convenience and I am able to cancel and reschedule for an in-person appointment if I desire.  - I also acknowledge that sensitive medical information may be discussed during this video visit appointment and that it is my responsibility to locate myself in a location that ensures privacy to my own level of comfort.  - I also acknowledge that I should not be participating in a video visit in a way that could cause danger to myself or to those around me (such as driving or walking).  If my provider is concerned about my safety, I understand that they have the right to terminate the visit.       DIAGNOSIS: Weight Loss Secondary to Laparoscopic Gastric bypass    HISTORY OF PRESENT ILLNESS: The patient is a 48 y.o. male who was seen for monitoring of weight loss and screening for possible medical complications and nutritional deficiencies. He last underwent a Laparoscopic Gastric bypass 4 months ago.     Measurements in clinic today are:  Ht:5' 6'' (1.676 m), Wt:(!) 278 lb (126.1 kg), Body mass index is 44.87 kg/m?Aaron Aas  Weight in (lb) to have BMI = 25: 154.6  Ideal body weight: 63.8 kg (140 lb 10.5 oz)  Adjusted ideal body weight: 88.7 kg (195 lb 9.5 oz)    He has lost 70 pounds since initial visit, with 36% EWL. The patient has lost 18 pound since last office visit.  He reports his pre-surgery weight at home was 330 pounds. We discussed weight stalls and strategies to overcome them.  The patient is doing well overall, with no significant nausea, vomiting, dysphagia, or abdominal pain reported.    CURRENT POST-OPERATIVE FINDINGS:   Diet: The patient eats 3 meals a day and 3 snacks in between.  Hunger feeling: The patient feels overall the same amount of hunger since the surgery, but eats smaller amounts.   Vitamins: The patient takes bariatric multi-vitamins daily.   Exercise: The patient exercises by walking for 20 minutes a few times a week and participates in weight training at the gym three days a week.  Quality of life: The patient feels the QOL has improved  Co-morbidity: GERD, PreDM    Patient denies use of NSAIDs, ASA, smoking, and alcohol.      CURRENT MEDICAL CONDITIONS:  Patient Active Problem List   Diagnosis    Central serous chorioretinopathy of left eye    History of laparoscopic adjustable gastric banding    Obesity    Retinopathy    GERD (gastroesophageal reflux disease)        MEDICATIONS:    Current Outpatient Medications:     oxyCODONE 5 mg tablet, Take 1 tablet (5 mg total) by mouth every six (6) hours as needed for Severe Pain (Pain Scale 7-10). Max Daily Amount: 20 mg, Disp: 5 tablet, Rfl: 0        PHYSICAL EXAMINATION:   Ht:5' 6'' (1.676 m) Wt:(!) 278 lb (126.1 kg) EAV:WUJW surface  area is 2.42 meters squared. Body mass index is 44.87 kg/m?Aaron Aas     Ht 5' 6'' (1.676 m)  ~ Wt (!) 278 lb (126.1 kg) Comment: Stated ~ BMI 44.87 kg/m?     GENERAL: Well nourished, well groomed in no acute distress.  NEURO: Alert and oriented x3, mood and affect appropriate.  LUNGS: Breathing without difficulty      LABS:    Pending    ASSESSMENT AND PLAN:   Weight loss secondary to laparoscopic gastric bypass. Recommended to drink at least 64oz of water, and eat 80-100 grams of protein. Recommended cardio exercises and strength exercises for overall health and maximize weight loss.  The current diet and exercise regimens were reviewed in clinic, including protein and average fluid intake.  Recommendations for continued safe weight loss, health and nutrition were discussed in detail. The patient was encouraged to continue diversifying the diet and to maintain and expand the exercise program.   Pt to continue taking multivitamins and  B complex daily. Comprehensive set of labs will be done on the patient and we will adjust supplementation as necessary pending results of the labwork.  Patient reinforced to continue to avoid NSAIDs, ASA, smoking, and alcohol.    A return appointment will be scheduled in 8 months for his next post-operative visit. Comprehensive labs will be performed at that time. Patient encouraged to call the clinic with any questions or concerns. Patient demonstrates understanding, is amenable to the outlined plan, and there are no barriers to education today.     The patient was seen remotely due to COVID-19 outbreak and current recommendations to avoid close contact. Patient does not have an urgent medical concern that needs to be addressed in person. CC Video Visit with the patient conducted through HIP[employer] secured platform in consultation.       The plan of care was discussed with Dr.Yijun Farrel Hones who is in agreement with what is outlined above.      Brigitte Canard, NP  [facility]Bariatric [facility]

## 2023-06-04 ENCOUNTER — Ambulatory Visit: Payer: BLUE CROSS/BLUE SHIELD | Attending: Registered"

## 2023-06-04 DIAGNOSIS — Z9884 Bariatric surgery status: Secondary | ICD-10-CM

## 2023-06-04 NOTE — Patient Instructions
 Hello,   Please see the schedule for the [facility] Bariatric Support Group below. Thank you!     Department of Surgery    Support Group [id number]    Prior to attending, visit our website to view possible changes in class schedule: [email or web address]      Date Day Time Location   February 17, 2023 Wednesday  Presenter:   Registered Dietitian    4:00PM-  4:30PM Zoom  Meeting ID: [phone]  Passcode: [phone]  Join Zoom Meeting   March 17, 2023 Wednesday  Presenter:   Registered Dietitian    4:00PM- 4:30PM Zoom  Meeting ID: [phone]  Passcode: [phone]  Join Zoom Meeting   April 14, 2023 Wednesday  Presenter:   Registered Dietitian    4:00PM- 4:30PM Zoom  Meeting ID: [phone]  Passcode: [phone]  Join Zoom Meeting   May 12, 2023 Wednesday  Presenter:   Registered Dietitian    4:00 PM- 4:30 PM Zoom  Meeting ID: [phone]  Passcode: [phone]  Join Zoom Meeting   Jun 16, 2023 Wednesday  Presenter:   Allyn Kenner PhD 4-5PM  Registered Dietitian 5-5:30PM   4:00PM-  5:30PM Zoom  Meeting ID: [phone]  Passcode: [phone]  Join Zoom Meeting   July 14, 2023 Wednesday  Presenter:   Allyn Kenner PhD 4-5PM  Registered Dietitian 5-5:30PM   4:00PM-  5:30PM Zoom  Meeting ID: [phone]  Passcode: [phone]  Join Zoom Meeting   August 11, 2023 Wednesday  Presenter:   Allyn Kenner PhD 4-5PM  Registered Dietitian 5-5:30PM   4:00PM-  5:30PM Zoom  Meeting ID: [phone]  Passcode: [phone]  Join Zoom Meeting   September 15, 2023 Wednesday  Presenter:   Allyn Kenner PhD 4-5PM  Registered Dietitian 5-5:30PM   4:00PM-  5:30PM Zoom  Meeting ID: [phone]  Passcode: [phone]  Join Zoom Meeting   October 13, 2023 Wednesday  Presenter:   Allyn Kenner PhD 4-5PM  Registered Dietitian 5-5:30PM   4:00PM-  5:30PM Zoom  Meeting ID: [phone]  Passcode: [phone]  Join Zoom Meeting   November 10, 2023 Wednesday  Presenter:   Allyn Kenner PhD 4-5PM  Registered Dietitian 5-5:30PM   4:00PM-  5:30PM Zoom  Meeting ID: [phone]  Passcode: [phone]  Join Zoom Meeting   December 15, 2023 Wednesday  Presenter:   Allyn Kenner PhD 4-5PM  Registered Dietitian 5-5:30PM   4:00PM-  5:30PM Zoom  Meeting ID: [phone]  Passcode: [phone]  Join Zoom Meeting   January 12, 2024 Wednesday  Presenter:   Allyn Kenner PhD 4-5PM  Registered Dietitian 5-5:30PM   4:00PM-  5:30PM Zoom  Meeting ID: [phone]  Passcode: [phone]  Join Zoom Meeting        For any questions, please contact:  [facility]  [phone]  [email or web address]  Monday - Friday 7am-4:30pm

## 2023-06-04 NOTE — Progress Notes
 [facility]  POST-OPERATIVE PATIENT EVALUATION    Patient Consent to Telehealth Questionnaire       05/02/2018     2:18 PM   [facility] TELEHEALTH PRECHECKIN QUESTIONS   By clicking ''I Agree'', I consent to the below:  I Agree     BARIATRIC SURGERY PERFORMED: Sleeve Gastrectomy    FOLLOW-UP VISIT:  4 months post-op    DATE OF SURGERY: 02/17/2023    ANTHROPOMETRICS:  There were no vitals filed for this visit.  There is no height or weight on file to calculate BMI.         Initial Weight: 348# (341# sx)  Total Weight Lost: 70# (36% excess weight loss)  CBW 278#       Ideal body weight: 63.8 kg (140 lb 10.5 oz)  Adjusted ideal body weight: 88.7 kg (195 lb 9.5 oz)      MEDICAL/SURGICAL HISTORY:  Past Medical History:   Diagnosis Date    Arthritis     Central serous chorioretinopathy of eye, left     Empyema (HCC/RAF)     GERD (gastroesophageal reflux disease)     Morbid obesity (HCC/RAF)     Obesity     S/P right hip fracture 1994    Sleep apnea      Past Surgical History:   Procedure Laterality Date    ABDOMINAL SURGERY      COLONOSCOPY  09/26/2020    FRACTURE SURGERY      HIP FRACTURE SURGERY Right 1994    w/Right Tibular Bone Graft    LAPAROSCOPIC CHOLECYSTECTOMY  2005    LAPAROSCOPIC GASTRIC BANDING      LUNG SURGERY      Right Total Hip Arthroplasty w/Removal of Hardware Right 12/17/2015    SKIN BIOPSY      UPPER GASTROINTESTINAL ENDOSCOPY  09/26/2020    Upper GI Endoscopy w/Biopsy  01/09/2022       MEDICATIONS:  Outpatient Medications Prior to Visit   Medication Sig    oxyCODONE 5 mg tablet Take 1 tablet (5 mg total) by mouth every six (6) hours as needed for Severe Pain (Pain Scale 7-10). Max Daily Amount: 20 mg     No facility-administered medications prior to visit.       LABS:  Results for orders placed or performed in visit on 11/09/22   Hgb A1c   Result Value Ref Range    Hgb A1c 5.6 <5.7 %        Results for orders placed or performed in visit on 11/09/22   Lipid Panel   Result Value Ref Range    Cholesterol 135 See Comment mg/dL    Cholesterol,LDL,Calc 80 <100 mg/dL    Cholesterol, HDL 36 (L) >40 mg/dL    Triglycerides 97 <161 mg/dL    Non-HDL,Chol,Calc 99 <130 mg/dL       Results for orders placed or performed in visit on 11/09/22   Vitamin B1 (Thiamine),Wh Blood   Result Value Ref Range    Vitamin B1 (Thiamine),Wh Blood 131 70 - 180 nmol/L          Results for orders placed or performed in visit on 11/09/22   Comprehensive Metabolic Panel   Result Value Ref Range    Sodium 140 135 - 146 mmol/L    Potassium 4.7 3.6 - 5.3 mmol/L    Chloride 104 96 - 106 mmol/L    Total CO2 24 20 - 30 mmol/L    Anion Gap  12 8 - 19 mmol/L    Glucose 84 65 - 99 mg/dL    Creatinine 2.95 6.21 - 1.30 mg/dL    Estimated GFR >30 See GFR Additional Information mL/min/1.37m2    GFR Additional Information See Comment     Urea Nitrogen 14 7 - 22 mg/dL    Calcium 9.6 8.6 - 86.5 mg/dL    Total Protein 6.9 6.1 - 8.2 g/dL    Albumin 3.9 3.9 - 5.0 g/dL    Bilirubin,Total 0.4 0.1 - 1.2 mg/dL    Alkaline Phosphatase 58 37 - 113 U/L    Aspartate Aminotransferase 34 13 - 62 U/L    Alanine Aminotransferase 34 8 - 70 U/L          Results for orders placed or performed in visit on 11/09/22   Vitamin D 25-OH; COMMON, deficiency status   Result Value Ref Range    Vitamin D,25-Hydroxy 19 (L) 20 - 50 ng/mL              ABNORMAL LAB VALUES: pending, will address abnormalities w/ pt      24-HOUR RECALL OF FOOD INTAKE:     TIME INTAKE RD COMMENTS   Breakfast  Premier protein shake (30g)    Snack     Lunch Beef pasta (22g protein)    Snack  apple    Dinner  2 pieces of salmon and 6 albacore sashimi (30 g)    Snack  Ground beef w/ onions tomatoes, sour cream (12g)                  ESTIMATED CALORIE INTAKE: 1200kcal     ADEQUATE PROTEIN INTAKE? Yes   ESTIMATED GRAMS OF PROTEIN BASED ON INTAKE: 95 g      BEVERAGE/FLUID CONSUMPTION: 80 fl oz water    CURRENT PHYSICAL ACTIVITY: strength training 3x/week; walking 20 minutes day 3x/week  BARRIERS TO PHYSICAL ACTIVITY: hip pain       VISIT NOTES: Pt states he is doing well, continues to tolerate solids, no GI issues. Compliant w/ bariatric multivitamin. Was only taking 1 capsule instead of 4 capsules of daily recommended dosage, but will adjust. Pt was having hard time drinking water, but confirmed he was drinking more than necessary and only needed 64 fl oz instead of 80 fl oz. Pt was receptive to all provided nutrition ed. Encouraged small frequent meals, every 3-4 hours. Reinforced portion control and weight reduction tips. Encouraged 80-100 grams of protein per day and 64 oz of water per day. Encouraged to track oral intake via food journal. Encouraged to supplement with daily MVI and B-complex vitamins. Encouraged to exercise 4x/week for 30-60 minutes of moderate exercise. Will continue to monitor his labs, weight and nutritional status per program protocol.       NUTRITION Dx: Inadequate vitamin intakerelated to  pt only taking 1 capsule out of the 4 needed for daily recommended bariatric multivitamin   as evidenced by 24 hr recall, pt report .  Status: New     HANDOUTS:  1. Support Group     GOALS:  1. Protein 80-100 grams per day  2. Water 64 ounces per day  3. Calories [phone] per day  4. Increase PA to 150 minutes moderate/vigorous per week  5. Small consistent meals every 2-3 hours  6. Chew food well to liquid consistency taking 30 minutes per meal  7. Supplement with daily MVI and B-complex vitamins      A total of 15 minutes  was spent via the HIP[employer] secured platform [employer] Video Visit with the patient in consultation.     This patient was seen in under the guidelines of the [facility] in coordination with Dr. Alwyn Juba and Dr. Lendia Quay.    Westly Hammersmith, RD

## 2023-06-07 ENCOUNTER — Ambulatory Visit: Payer: BLUE CROSS/BLUE SHIELD

## 2023-06-07 DIAGNOSIS — H539 Unspecified visual disturbance: Secondary | ICD-10-CM

## 2023-06-07 DIAGNOSIS — H109 Unspecified conjunctivitis: Secondary | ICD-10-CM

## 2023-06-07 MED ORDER — OFLOXACIN 0.3 % OP SOLN
1 [drp] | Freq: Four times a day (QID) | OPHTHALMIC | 0 refills | Status: AC
Start: 2023-06-07 — End: ?

## 2023-06-07 NOTE — Progress Notes
 Patient Consent to Telehealth Questionnaire       05/02/2018     2:18 PM   [facility] TELEHEALTH PRECHECKIN QUESTIONS   By clicking ''I Agree'', I consent to the below:  I Agree     - I agree  to be treated via a video visit and acknowledge that I may be liable for any relevant copays or coinsurance depending on my insurance plan.  - I understand that this video visit is offered for my convenience and I am able to cancel and reschedule for an in-person appointment if I desire.  - I also acknowledge that sensitive medical information may be discussed during this video visit appointment and that it is my responsibility to locate myself in a location that ensures privacy to my own level of comfort.  - I also acknowledge that I should not be participating in a video visit in a way that could cause danger to myself or to those around me (such as driving or walking).  If my provider is concerned about my safety, I understand that they have the right to terminate the visit.     PATIENT: Russell Wiggins   MRN: [id number]   DOB: [birth year]   DATE OF SERVICE: 06/07/2023   PHONE: [phone]     PRIMARY CARE PROVIDER: Rise Cheng, MD  Subjective:       History:  Russell Wiggins is a 48 y.o.    European  White male who presents for a televideo for:  L eye with redness, pain and discharge for the last 2-3 days.  Vision changes for several months. Thinks its kaleidoscope vision that last a few seconds. Last happened a few weeks ago, no headaches.    Past Medical History:   Diagnosis Date    Arthritis     Central serous chorioretinopathy of eye, left     Empyema (HCC/RAF)     GERD (gastroesophageal reflux disease)     Morbid obesity (HCC/RAF)     Obesity     S/P right hip fracture 1994    Sleep apnea          Objective:     General appearance: alert, well appearing, and in no distress.     Assessment/Plan:   Diagnoses and all orders for this visit:    Conjunctivitis of left eye, unspecified conjunctivitis type  - ofloxacin 0.3% ophthalmic solution; Place 1 drop into the left eye four (4) times daily.    Vision changes  -     Referral to Ophthalmology        Followup: PRN  Instructed to call or be seen again should symptoms worsen or fail to improve.     The above diagnosis, orders, and follow-up were discussed with the patient. The patient had all questions answered satisfactorily and understands this recommended plan of care.      Author:  Rise Cheng 06/07/2023 9:09 AM

## 2023-06-25 ENCOUNTER — Ambulatory Visit
Payer: Commercial Managed Care - Pharmacy Benefit Manager | Attending: Student in an Organized Health Care Education/Training Program

## 2023-06-25 DIAGNOSIS — M25512 Pain in left shoulder: Secondary | ICD-10-CM

## 2023-06-25 NOTE — Patient Instructions
 What is calcific tendonitis?  Calcific Tendonitis    Calcific tendonitis of the shoulder happens when calcium deposits form on the tendons of your shoulder. The tissues around the deposit can become inflamed, causing a great deal of shoulder pain. This condition is fairly common although the cause is unknown and not related to injury, diet or osteoporosis. As well as the pressure caused by the calcium build up, the deposits reduce the space between the acromion and the rotator cuff leading to impingement (pinching of the tendons). Calcific tendonitis most commonly affects people over the age of 16.    There are two different types of calcific tendonitis of the shoulder: degenerative calcification and reactive calcification.    Degenerative Calcification  As part of the aging process, blood flow to the tendons of the rotator cuff decreases. This makes the tendon weaker. Due to the wear and tear as we use our shoulder, the fibres of the tendons begin to fray and tear, just like a worn-out rope. Calcium deposits form in the damaged tendons as a part of the healing process.    Reactive calcification  Why it occurs is not clear. It does not seem to be related to degeneration, though it is more likely to cause shoulder pain than degenerative calcification. Doctors think of reactive calcification in three stages.    Pre-calcific stage: - the tendon changes in ways that make calcium deposits more likely to form  Calcific stage: - the calcium crystals are deposited into the tendon. The deposits then begin to be absorbed by the body and disappear. This is usually when the pain will occur.  Post-calcific stage: - the tendon heals and is remodeled with new tissue.  What are the symptoms of calcific tendonitis?  While the calcium is being deposited, you may feel only mild to moderate pain or possibly no pain at all. For some unknown reason, calcific tendonitis becomes very painful when the deposits are being reabsorbed, often constant and nagging. At times the pain travels down the arm to the hand and is aggravated by the lifting of your arm. The pain and stiffness of calcific tendonitis can cause you to lose motion in your shoulder. At its most severe, the pain may interfere with your sleep.    How will I know if I have calcific tendonitis?  Your surgeon will take a detailed medical history and will conduct a thorough physical examination of the shoulder. An x-ray will usually confirm the presence of calcium deposits and will also help to pinpoint the exact location. Several x-rays may be required over time to assist the surgeon track the calcium changes and determine whether surgical is required.    Treatment Options  The main goal will be to reduce the pain and inflammation in the shoulder joint. Along with resting of the joint, the following options may be suggested:    Anti-inflammatory Medications - These medications can be helpful in reducing swelling or inflammation in the shoulder joint, there in turn reducing pain.  Injections - Cortisone is a very powerful steroid and can be very effective at easing inflammation and swelling. The effects are temporary, but can provide relief for several months.  Lavage - During the time when the calcium deposits are being reabsorbed, the pain can be quite significant. Your surgeon may suggest trying to remove the calcium by inserting two large needles and rinsing the area with sterile saline solution. This procedure can loosen and break down the calcium particles which can then be  removed with the needles. Even if the calcium cannot be removed, the lavage may reduce the pressure in the tendon, which can lessen the pain.  Physiotherapy - Physiotherapy will also focus on reducing the pain and inflammation, which may include the use of ice or heat. The use of multiple ultrasound treatments to reduce the size of the calcium deposit is beneficial and may offer better arm function.  Shock Wave Therapy - A machine is used to generate wave pulses directed at the painful area to help break up the calcium deposits so they may be more easily absorbed by the body. This treatment may also reduce the pain and the size of the deposits.    Do I need surgery?  If the loss of movement and pain continue to interfere with your daily activities your surgeon may recommend surgery. An arthroscopy (key hole surgery) is done where a small incision is made in the tendon and the calcium removed. Care is taken to ensure that the calcium has been completely removed by doing an x-ray at the end of the procedure. In very rare circumstances open surgery may be necessary.    What about Rehabilitation?      Rehabilitation after shoulder surgery can be a slow process. It is extremely important that you begin moving and exercising the arm following your procedure, unless otherwise specified. The physiotherapist will provide a program of strengthening and stretching exercises for you to do at home.    Even if you don?t need surgery, your surgeon may suggest that you attend physiotherapy sessions for 4 to 6 weeks. Strengthening the rotator cuff muscles can actually decrease the pressure on the calcium deposits in the tendon.

## 2023-06-25 NOTE — Progress Notes
 [facility]      PATIENT: Russell Wiggins  MRN: [id number]  DOB: [birth year]  DATE OF SERVICE: 06/25/2023    CHIEF COMPLAINT:   Chief Complaint   Patient presents with    Shoulder Pain     Left side, x 40 hrs        Subjective:      Russell Wiggins is a 48 y.o. male who presents with L shoulder pain since yesterday. Started with aching pain yesterday morning and then worsened until the pain was waking him up at night.   No associated chest pain, jaw pain, neck pain, dyspnea.     Review of Systems   All other systems reviewed and are negative.        Objective:     Vitals:    06/25/23 1740 06/25/23 1747   BP: 140/96 139/97   Pulse: (!) 111    Resp: 18    Temp: 37.2 ?C (99 ?F)    TempSrc: Tympanic    Weight: (!) 288 lb (130.6 kg)    Height: 5' 6'' (1.676 m)         Physical Exam  Constitutional:       General: He is not in acute distress.  HENT:      Head: Atraumatic.   Cardiovascular:      Rate and Rhythm: Normal rate and regular rhythm.      Heart sounds: Normal heart sounds.   Pulmonary:      Breath sounds: Normal breath sounds.   Abdominal:      General: Abdomen is flat. Bowel sounds are normal.      Palpations: Abdomen is soft.   Musculoskeletal:      Comments: Active and passive range of motion (decreased forward flexion, abduction, internal/external shoulder rotation). Pain with palpation of subacromial area. Unable to do special testing due to pain.      Skin:     General: Skin is warm and dry.   Neurological:      Mental Status: He is alert.               No results found for this or any previous visit (from the past 24 hours).     Assessment/Plan:         1. Acute pain of left shoulder (Primary)  - exam suspicious for calcific tendonitis. Per my interpretation of xray, no obvious calcification noted. US  ordered to rule out rotator cuff tendopathy. Pain management reviewed, unable to do NSAIDs due to gastric bypass. Recommend ice, voltaren gel, lidocaine patch. Short course tramadol sent for pain at night. Can consider shoulder injection depending on results.   - XR shoulder ap int-ext+axillary left (3 views); Future  - US  shoulder left; Future      The above plan of care, diagnosis, orders, and follow-up were discussed with the patient, and the patient demonstrated full understanding. Questions related to this recommended plan of care were answered.     Emergency department precautions thoroughly reviewed with patient regarding aforementioned assessment. If any clinical worsening whatsoever in the meantime, patient instructed to present to nearest emergency department. Patient expresses understanding.    No follow-ups on file.    Adrien Horner, MD  Family Medicine/Immediate Care

## 2023-06-25 NOTE — Addendum Note
 Addended by: Kathy Parker on: 06/25/2023 06:52 PM     Modules accepted: Orders

## 2023-06-26 ENCOUNTER — Inpatient Hospital Stay
Payer: Commercial Managed Care - Pharmacy Benefit Manager | Attending: Student in an Organized Health Care Education/Training Program

## 2023-06-26 MED ORDER — DICLOFENAC SODIUM 1 % EX GEL
4 g | Freq: Four times a day (QID) | TOPICAL | 0 refills | 21.00000 days | Status: AC
Start: 2023-06-26 — End: ?

## 2023-06-26 MED ORDER — DICLOFENAC SODIUM 1 % EX GEL
4 g | Freq: Four times a day (QID) | TOPICAL | 0 refills | 21.00000 days | Status: AC
Start: 2023-06-26 — End: 2023-06-26

## 2023-06-26 MED ORDER — TRAMADOL HCL 50 MG PO TABS
50 mg | ORAL_TABLET | Freq: Four times a day (QID) | ORAL | 0 refills | 10.00000 days | Status: AC | PRN
Start: 2023-06-26 — End: ?

## 2023-06-26 MED ORDER — TRAMADOL HCL 50 MG PO TABS
50 mg | ORAL_TABLET | Freq: Four times a day (QID) | ORAL | 0 refills | 10.00000 days | Status: AC | PRN
Start: 2023-06-26 — End: 2023-06-26

## 2023-06-28 ENCOUNTER — Other Ambulatory Visit: Payer: PRIVATE HEALTH INSURANCE

## 2023-06-30 ENCOUNTER — Inpatient Hospital Stay: Payer: BLUE CROSS/BLUE SHIELD | Attending: Student in an Organized Health Care Education/Training Program

## 2023-07-02 ENCOUNTER — Inpatient Hospital Stay: Payer: BLUE CROSS/BLUE SHIELD | Attending: Student in an Organized Health Care Education/Training Program

## 2023-07-02 DIAGNOSIS — M25512 Pain in left shoulder: Secondary | ICD-10-CM

## 2023-07-06 ENCOUNTER — Ambulatory Visit: Payer: BLUE CROSS/BLUE SHIELD

## 2023-07-06 ENCOUNTER — Other Ambulatory Visit: Payer: BLUE CROSS/BLUE SHIELD

## 2023-07-06 DIAGNOSIS — M67912 Unspecified disorder of synovium and tendon, left shoulder: Secondary | ICD-10-CM

## 2023-07-13 ENCOUNTER — Ambulatory Visit: Payer: BLUE CROSS/BLUE SHIELD

## 2023-07-13 DIAGNOSIS — K219 Gastro-esophageal reflux disease without esophagitis: Secondary | ICD-10-CM

## 2023-07-13 DIAGNOSIS — R0789 Other chest pain: Secondary | ICD-10-CM

## 2023-07-13 NOTE — Progress Notes
 Patient Consent to Telehealth Questionnaire       05/02/2018     2:18 PM   [facility] TELEHEALTH PRECHECKIN QUESTIONS   By clicking ''I Agree'', I consent to the below:  I Agree     - I agree  to be treated via a video visit and acknowledge that I may be liable for any relevant copays or coinsurance depending on my insurance plan.  - I understand that this video visit is offered for my convenience and I am able to cancel and reschedule for an in-person appointment if I desire.  - I also acknowledge that sensitive medical information may be discussed during this video visit appointment and that it is my responsibility to locate myself in a location that ensures privacy to my own level of comfort.  - I also acknowledge that I should not be participating in a video visit in a way that could cause danger to myself or to those around me (such as driving or walking).  If my provider is concerned about my safety, I understand that they have the right to terminate the visit.     PATIENT: Russell Wiggins   MRN: [id number]   DOB: [birth year]   DATE OF SERVICE: 07/13/2023   PHONE: [phone]     PRIMARY CARE PROVIDER: Rise Cheng, MD  Subjective:       History:  Russell Wiggins is a 48 y.o.    European  White male who presents for a televideo for chest wall discomfort in the L side of anterior chest area for about 3 days, has hx of GERD and usually takes tums for it, has not taken any recently.     Past Medical History:   Diagnosis Date    Arthritis     Central serous chorioretinopathy of eye, left     Empyema (HCC/RAF)     GERD (gastroesophageal reflux disease)     Morbid obesity (HCC/RAF)     Obesity     S/P right hip fracture 1994    Sleep apnea        Objective:     General appearance: alert, well appearing, and in no distress.     Assessment/Plan:   Diagnoses and all orders for this visit:    Chest wall discomfort  -possibly GERD related     Gastroesophageal reflux disease without esophagitis  -will start otc ppi trial  -f/u in office next week if does not resolve      Followup: PRN  Instructed to call or be seen again should symptoms worsen or fail to improve.     The above diagnosis, orders, and follow-up were discussed with the patient. The patient had all questions answered satisfactorily and understands this recommended plan of care.      Author:  Rise Cheng 07/13/2023 3:42 PM

## 2023-08-27 ENCOUNTER — Other Ambulatory Visit: Payer: PRIVATE HEALTH INSURANCE

## 2023-08-30 ENCOUNTER — Ambulatory Visit: Payer: BLUE CROSS/BLUE SHIELD | Attending: Registered"

## 2023-08-30 DIAGNOSIS — Z9884 Bariatric surgery status: Principal | ICD-10-CM

## 2023-08-30 NOTE — Progress Notes
 [facility]  POST-OPERATIVE PATIENT EVALUATION    Patient Consent to Telehealth Questionnaire       05/02/2018     2:18 PM   [facility] TELEHEALTH PRECHECKIN QUESTIONS   By clicking ''I Agree'', I consent to the below:  I Agree     BARIATRIC SURGERY PERFORMED: Roux en-Y Gastric Bypass    FOLLOW-UP VISIT: 7 month post-op     DATE OF SURGERY: 01/29/2023    ANTHROPOMETRICS:  Vitals:    08/30/23 1005   Weight: (!) 266 lb 9.6 oz (120.9 kg)   Height: 5' 6'' (1.676 m)     Body mass index is 43.03 kg/m?Russell Wiggins         Initial Weight: 341# (pt states 320# before sx)   Total Weight Lost: 74.4# (38% excess weight loss)  CBW 266.6#  Weight in (lb) to have BMI = 25: 154.6  Ideal body weight: 63.8 kg (140 lb 10.5 oz)  Adjusted ideal body weight: 86.7 kg (191 lb 0.5 oz)      MEDICAL/SURGICAL HISTORY:  Past Medical History:   Diagnosis Date    Arthritis     Central serous chorioretinopathy of eye, left     Empyema (HCC/RAF)     GERD (gastroesophageal reflux disease)     Morbid obesity (HCC/RAF)     Obesity     S/P right hip fracture 1994    Sleep apnea      Past Surgical History:   Procedure Laterality Date    ABDOMINAL SURGERY      COLONOSCOPY  09/26/2020    FRACTURE SURGERY      HIP FRACTURE SURGERY Right 1994    w/Right Tibular Bone Graft    LAPAROSCOPIC CHOLECYSTECTOMY  2005    LAPAROSCOPIC GASTRIC BANDING      LUNG SURGERY      Right Total Hip Arthroplasty w/Removal of Hardware Right 12/17/2015    SKIN BIOPSY      UPPER GASTROINTESTINAL ENDOSCOPY  09/26/2020    Upper GI Endoscopy w/Biopsy  01/09/2022       MEDICATIONS:  Outpatient Medications Prior to Visit   Medication Sig    diclofenac  Sodium 1% gel Apply 4 g topically four (4) times daily.    ofloxacin  0.3% ophthalmic solution Place 1 drop into the left eye four (4) times daily.    traMADol  50 mg tablet Take 1 tablet (50 mg total) by mouth every six (6) hours as needed for Moderate Pain (Pain Scale 4-6) or Severe Pain (Pain Scale 7-10). Max Daily Amount: 200 mg     No facility-administered medications prior to visit.       LABS:  Results for orders placed or performed in visit on 11/09/22   Hgb A1c   Result Value Ref Range    Hgb A1c 5.6 <5.7 %        Results for orders placed or performed in visit on 11/09/22   Lipid Panel   Result Value Ref Range    Cholesterol 135 See Comment mg/dL    Cholesterol,LDL,Calc 80 <100 mg/dL    Cholesterol, HDL 36 (L) >40 mg/dL    Triglycerides 97 <849 mg/dL    Non-HDL,Chol,Calc 99 <130 mg/dL       Results for orders placed or performed in visit on 11/09/22   Vitamin B1 (Thiamine),Wh Blood   Result Value Ref Range    Vitamin B1 (Thiamine),Wh Blood 131 70 - 180 nmol/L  Results for orders placed or performed in visit on 11/09/22   Comprehensive Metabolic Panel   Result Value Ref Range    Sodium 140 135 - 146 mmol/L    Potassium 4.7 3.6 - 5.3 mmol/L    Chloride 104 96 - 106 mmol/L    Total CO2 24 20 - 30 mmol/L    Anion Gap 12 8 - 19 mmol/L    Glucose 84 65 - 99 mg/dL    Creatinine 9.00 9.39 - 1.30 mg/dL    Estimated GFR >10 See GFR Additional Information mL/min/1.65m2    GFR Additional Information See Comment     Urea Nitrogen 14 7 - 22 mg/dL    Calcium 9.6 8.6 - 89.5 mg/dL    Total Protein 6.9 6.1 - 8.2 g/dL    Albumin 3.9 3.9 - 5.0 g/dL    Bilirubin,Total 0.4 0.1 - 1.2 mg/dL    Alkaline Phosphatase 58 37 - 113 U/L    Aspartate Aminotransferase 34 13 - 62 U/L    Alanine Aminotransferase 34 8 - 70 U/L          Results for orders placed or performed in visit on 11/09/22   Vitamin D 25-OH; COMMON, deficiency status   Result Value Ref Range    Vitamin D,25-Hydroxy 19 (L) 20 - 50 ng/mL              ABNORMAL LAB VALUES: pending, will address abnormalities w/ pt      24-HOUR RECALL OF FOOD INTAKE:     TIME INTAKE RD COMMENTS   Breakfast  Hardboiled egg and apple     Snack String cheese     Lunch Pasta and meat balls     Snack  String cheese; grapes    Dinner  Tandoori chicken; onions, bell peppers     Snack                    ESTIMATED CALORIE INTAKE: 1200-1500kcal    ADEQUATE PROTEIN INTAKE? Yes  ESTIMATED GRAMS OF PROTEIN BASED ON INTAKE: 80-100 g      BEVERAGE/FLUID CONSUMPTION: 60-80 fl oz     CURRENT PHYSICAL ACTIVITY: gym strength training 3x/week; walking 30-45 minutes 3x/week;   BARRIERS TO PHYSICAL ACTIVITY: hip pain prevents      VISIT NOTES: Pt is at 38% EWL, continues to tolerate solids well, a few episodes of dumping when eating out, but otherwise able to control w/ positive eating habits. Meeting daily protein and liquid goals. Compliant w/ bariatric multivitamin w/ iron. Pending labs, not taken during 4 months post-op. Encouraged pt to avoid exceeding 2 oz of cheese daily and continue increasing fruits and veggie intake once daily protein intake is met. Pt states unable to walk longer d/t hip pain.  Encouraged small frequent meals, every 3-4 hours. Reinforced portion control and weight reduction tips. Encouraged 80-100 grams of protein per day and 64 oz of water per day. Encouraged to track oral intake via food journal. Encouraged to supplement with daily MVI and B-complex vitamins. Encouraged to exercise 4x/week for 30-60 minutes of moderate exercise. Will continue to monitor his labs, weight and nutritional status per program protocol.       NUTRITION Dx: Altered GI functionrelated to s/p gastric bypass  as evidenced by pt required to follow bariatric diet and vitamin regimen .  Status: Ongoing     HANDOUTS:  1. Support Group     GOALS:  1. Protein 80-100 grams per day  2. Water  64 ounces per day  3. Calories [phone] per day  4. Increase PA to 150 minutes moderate/vigorous per week  5. Small consistent meals every 2-3 hours  6. Chew food well to liquid consistency taking 30 minutes per meal  7. Supplement with daily MVI and B-complex vitamins      A total of 15 minutes was spent via the HIPPA secured platform [employer] Video Visit with the patient in consultation.     This patient was seen in under the guidelines of the [facility] in coordination with Dr. Modena Door and Dr. Camellia Shaw.    [facility] MICAEL Campi, RD

## 2023-08-30 NOTE — Patient Instructions
 Hello,   Please see the schedule for the [facility] Bariatric Support Group below. Thank you!     Department of Surgery    Support Group 2025    Prior to attending, visit our website to view possible changes in class schedule:   [email or web address]      Date Day Time Location   February 17, 2023 Wednesday  Presenter:   Registered Dietitian    4:00PM-  4:30PM Zoom  Meeting ID: [phone]  Passcode: [phone]  Join Zoom Meeting   March 17, 2023 Wednesday  Presenter:   Registered Dietitian    4:00 PM-4:30 PM Zoom  Meeting ID: [phone]  Passcode: [phone]  Join Zoom Meeting   April 14, 2023 Wednesday  Presenter:   Registered Dietitian    4:00 PM-4:30 PM Zoom  Meeting ID: [phone]  Passcode: [phone]  Join Zoom Meeting   May 12, 2023 Wednesday  Presenter:   Registered Dietitian    4:00 PM-4:30 PM Zoom  Meeting ID: [phone]  Passcode: [phone]  Join Zoom Meeting   Jun 16, 2023 Wednesday  Presenter:   Diannia Forward PhD 4-5PM  Registered Dietitian 5-5:30PM   4:00PM-  5:30PM Zoom  Meeting ID: [phone]  Passcode: [phone]  Join Zoom Meeting   July 21, 2023 (updated) Wednesday  Presenter:   Diannia Forward PhD 4-5PM  Registered Dietitian 5-5:30PM   4:00PM-  5:30PM Zoom  Meeting ID: [phone]  Passcode: [phone]  Join Zoom Meeting   August 18, 2023  (updated) Wednesday  Presenter:   Diannia Forward PhD 4-5PM  Registered Dietitian 5-5:30PM   4:00PM-  5:30PM Zoom  Meeting ID: [phone]  Passcode: [phone]  Join Zoom Meeting   September 22, 2023 (updated) Wednesday  Presenter:   Diannia Forward PhD 4-5PM  Registered Dietitian 5-5:30PM   4:00PM-  5:30PM Zoom  Meeting ID: [phone]  Passcode: [phone]  Join Zoom Meeting   October 13, 2023 Wednesday  Presenter:   Diannia Forward PhD 4-5PM  Registered Dietitian 5-5:30PM   4:00PM-  5:30PM Zoom  Meeting ID: [phone]  Passcode: [phone]  Join Zoom Meeting   November 10, 2023 Wednesday  Presenter:   Diannia Forward PhD 4-5PM  Registered Dietitian 5-5:30PM   4:00PM-  5:30PM Zoom  Meeting ID: [phone]  Passcode: [phone]  Join Zoom Meeting   December 15, 2023 Wednesday  Presenter:   Diannia Forward PhD 4-5PM  Registered Dietitian 5-5:30PM   4:00PM-  5:30PM Zoom  Meeting ID: [phone]  Passcode: [phone]  Join Zoom Meeting   January 12, 2024 Wednesday  Presenter:   Diannia Forward PhD 4-5PM  Registered Dietitian 5-5:30PM   4:00PM-  5:30PM Zoom  Meeting ID: [phone]  Passcode: [phone]  Join Zoom Meeting        For any questions, please contact:  [employer] [facility]  [phone]  [email or web address]  Monday - Friday 7am-4:30pm

## 2024-02-14 ENCOUNTER — Other Ambulatory Visit: Payer: PRIVATE HEALTH INSURANCE

## 2024-02-15 ENCOUNTER — Ambulatory Visit: Payer: Commercial Managed Care - Pharmacy Benefit Manager

## 2024-02-15 DIAGNOSIS — E66813 Obesity, Class III, BMI 40-49.9 (morbid obesity) (HCC/RAF): Secondary | ICD-10-CM

## 2024-02-15 DIAGNOSIS — E669 Obesity, unspecified: Principal | ICD-10-CM

## 2024-02-15 DIAGNOSIS — Z9884 Bariatric surgery status: Secondary | ICD-10-CM

## 2024-02-15 MED ORDER — TIRZEPATIDE-WEIGHT MANAGEMENT 2.5 MG/0.5ML SC SOAJ
2.5 mg | SUBCUTANEOUS | 2 refills | 28.00000 days | Status: AC
Start: 2024-02-15 — End: ?

## 2024-02-15 NOTE — Progress Notes
 [place] [facility]  POST-OPERATIVE PATIENT EVALUATION    Patient Consent to Telehealth Questionnaire       05/02/2018     2:18 PM   [facility] TELEHEALTH PRECHECKIN QUESTIONS   By clicking ''I Agree'', I consent to the below:  I Agree     BARIATRIC SURGERY PERFORMED: Roux en-Y Gastric Bypass    FOLLOW-UP VISIT: Annual Visit    DATE OF SURGERY: 02/17/2023    ANTHROPOMETRICS:  Vitals:    02/15/24 1016   Weight: (!) 254 lb (115.2 kg)   Height: 5' 6'' (1.676 m)     Body mass index is 41 kg/m?SABRA         Initial Weight: 341# (pt states 320# before sx)   Total Weight Lost: 87# (44% excess weight loss)  CBW 254#  Weight in (lb) to have BMI = 25: 154.6  Ideal body weight: 63.8 kg (140 lb 10.5 oz)  Adjusted ideal body weight: 84.4 kg (185 lb 15.9 oz)      MEDICAL/SURGICAL HISTORY:  Past Medical History:   Diagnosis Date    Arthritis     Central serous chorioretinopathy of eye, left     Empyema (HCC/RAF)     GERD (gastroesophageal reflux disease)     Morbid obesity (HCC/RAF)     Obesity     S/P right hip fracture 1994    Sleep apnea      Past Surgical History:   Procedure Laterality Date    ABDOMINAL SURGERY      COLONOSCOPY  09/26/2020    FRACTURE SURGERY      HIP FRACTURE SURGERY Right 1994    w/Right Tibular Bone Graft    LAPAROSCOPIC CHOLECYSTECTOMY  2005    LAPAROSCOPIC GASTRIC BANDING      LUNG SURGERY      Right Total Hip Arthroplasty w/Removal of Hardware Right 12/17/2015    SKIN BIOPSY      UPPER GASTROINTESTINAL ENDOSCOPY  09/26/2020    Upper GI Endoscopy w/Biopsy  01/09/2022       MEDICATIONS:  Outpatient Medications Prior to Visit   Medication Sig    diclofenac  Sodium 1% gel Apply 4 g topically four (4) times daily.    ofloxacin  0.3% ophthalmic solution Place 1 drop into the left eye four (4) times daily.    traMADol  50 mg tablet Take 1 tablet (50 mg total) by mouth every six (6) hours as needed for Moderate Pain (Pain Scale 4-6) or Severe Pain (Pain Scale 7-10). Max Daily Amount: 200 mg     No facility-administered medications prior to visit.       LABS:  Results for orders placed or performed in visit on 11/09/22   Hgb A1c   Result Value Ref Range    Hgb A1c 5.6 <5.7 %        Results for orders placed or performed in visit on 11/09/22   Lipid Panel   Result Value Ref Range    Cholesterol 135 See Comment mg/dL    Cholesterol,LDL,Calc 80 <100 mg/dL    Cholesterol, HDL 36 (L) >40 mg/dL    Triglycerides 97 <849 mg/dL    Non-HDL,Chol,Calc 99 <130 mg/dL       Results for orders placed or performed in visit on 11/09/22   Vitamin B1 (Thiamine),Wh Blood   Result Value Ref Range    Vitamin B1 (Thiamine),Wh Blood 131 70 - 180 nmol/L          Results for orders placed  or performed in visit on 11/09/22   Comprehensive Metabolic Panel   Result Value Ref Range    Sodium 140 135 - 146 mmol/L    Potassium 4.7 3.6 - 5.3 mmol/L    Chloride 104 96 - 106 mmol/L    Total CO2 24 20 - 30 mmol/L    Anion Gap 12 8 - 19 mmol/L    Glucose 84 65 - 99 mg/dL    Creatinine 9.00 9.39 - 1.30 mg/dL    Estimated GFR >10 See GFR Additional Information mL/min/1.69m2    GFR Additional Information See Comment     Urea Nitrogen 14 7 - 22 mg/dL    Calcium 9.6 8.6 - 89.5 mg/dL    Total Protein 6.9 6.1 - 8.2 g/dL    Albumin 3.9 3.9 - 5.0 g/dL    Bilirubin,Total 0.4 0.1 - 1.2 mg/dL    Alkaline Phosphatase 58 37 - 113 U/L    Aspartate Aminotransferase 34 13 - 62 U/L    Alanine Aminotransferase 34 8 - 70 U/L          Results for orders placed or performed in visit on 11/09/22   Vitamin D 25-OH; COMMON, deficiency status   Result Value Ref Range    Vitamin D,25-Hydroxy 19 (L) 20 - 50 ng/mL              ABNORMAL LAB VALUES: pending, will address abnormalities w/ pt      24-HOUR RECALL OF FOOD INTAKE:     TIME INTAKE RD COMMENTS   Breakfast  Hardboiled egg, apple slices    Snack Protein biscuit w/ grapes     Lunch Chicken breast, side salad (tomato and letuce0    Snack  Pt cannot remember    Dinner  Spicy tuna roll     Snack                    ESTIMATED CALORIE INTAKE: 1200-1500kcal     ADEQUATE PROTEIN INTAKE? Yes   ESTIMATED GRAMS OF PROTEIN BASED ON INTAKE: 60-80 g      BEVERAGE/FLUID CONSUMPTION: 40-60 fl oz     CURRENT PHYSICAL ACTIVITY: training 1x/week; strength training 2x/week;      BARRIERS TO PHYSICAL ACTIVITY: hip pain prevents       VISIT NOTES: At 44% EWL; Pt states he has been doing well, reports 1-2 episodes of diarrhea per week likely caused by diet/dumping syndrome. Encouraged pt to avoid excessively fatty foods such as Indian or cream based items where the fat content may not be as obvious and can lead to dumping. Compliant w/ bariatric multivitamin w/ iron.  Encouraged small frequent meals, every 3-4 hours. Reinforced portion control and weight reduction tips. Encouraged 80-100 grams of protein per day and 64 oz of water per day. Encouraged to track oral intake via food journal. Encouraged to supplement with daily MVI and B-complex vitamins. Encouraged to exercise 4x/week for 30-60 minutes of moderate exercise. Will continue to monitor his labs, weight and nutritional status per program protocol.       NUTRITION Dx: Inadequate fluid intakerelated to pt not meeting recommended 64 oz fluid per day  as evidenced by 24 hr recall, pt report .  Status: Ongoing     HANDOUTS:  1. Support Group    GOALS:  1. Protein 80-100 grams per day  2. Water 64 ounces per day  3. Calories [phone] per day  4. Increase PA to 150 minutes moderate/vigorous per week  5.  Small consistent meals every 2-3 hours  6. Chew food well to liquid consistency taking 30 minutes per meal  7. Supplement with daily MVI and B-complex vitamins      A total of 15 minutes was spent via the HIPPA secured platform [employer]Connect Video Visit with the patient in consultation.     This patient was seen in under the guidelines of the [facility] in coordination with Dr. Modena Door and Dr. Camellia Shaw.    [facility] MICAEL Campi, RD

## 2024-02-15 NOTE — Patient Instructions
 Hello,   Please see the schedule for the [facility] Bariatric Support Group below. Thank you!     Department of Surgery    Support Group 2026    Prior to attending, visit our website to view possible changes in class schedule:   [email or web address]      Date Day Time Location   February 16, 2024 Wednesday  Presenter:   Powell Eagles PhD 4-5PM  Registered Dietitian 5-5:30PM   4:00PM-  5:30PM Zoom  Meeting ID: [phone]  Passcode: [phone]  Join Zoom Meeting   March 15, 2024 Wednesday  Presenter:   Powell Eagles PhD 4-5PM  Registered Dietitian 5-5:30PM   4:00 PM-5:30 PM Zoom  Meeting ID: [phone]  Passcode: [phone]  Join Zoom Meeting   April 12, 2024 Wednesday  Presenter:   Powell Eagles PhD 4-5PM  Registered Dietitian 5-5:30PM   4:00 PM-5:30 PM Zoom  Meeting ID: [phone]  Passcode: [phone]  Join Zoom Meeting   May 10, 2024 Wednesday  Presenter:   Powell Eagles PhD 4-5PM  Registered Dietitian 5-5:30PM   4:00 PM-5:30 PM Zoom  Meeting ID: [phone]  Passcode: [phone]  Join Zoom Meeting   Jun 14, 2024 Wednesday  Presenter:   Powell Eagles PhD 4-5PM  Registered Dietitian 5-5:30PM   4:00PM-  5:30PM Zoom  Meeting ID: [phone]  Passcode: [phone]  Join Zoom Meeting   July 12, 2024  Wednesday  Presenter:   Powell Eagles PhD 4-5PM  Registered Dietitian 5-5:30PM   4:00PM-  5:30PM Zoom  Meeting ID: [phone]  Passcode: [phone]  Join Zoom Meeting   August 09, 2024   Wednesday  Presenter:   Powell Eagles PhD 4-5PM  Registered Dietitian 5-5:30PM   4:00PM-  5:30PM Zoom  Meeting ID: [phone]  Passcode: [phone]  Join Zoom Meeting   September 13, 2024 Wednesday  Presenter:   Powell Eagles PhD 4-5PM  Registered Dietitian 5-5:30PM   4:00PM-  5:30PM Zoom  Meeting ID: [phone]  Passcode: [phone]  Join Zoom Meeting   October 11, 2024 Wednesday  Presenter:   Powell Eagles PhD 4-5PM  Registered Dietitian 5-5:30PM   4:00PM-  5:30PM Zoom  Meeting ID: [phone]  Passcode: [phone]  Join Zoom Meeting   November 15, 2024 Wednesday  Presenter:   Powell Eagles PhD 4-5PM  Registered Dietitian 5-5:30PM   4:00PM-  5:30PM Zoom  Meeting ID: [phone]  Passcode: [phone]  Join Zoom Meeting   December 13, 2024 Wednesday  Presenter:   Powell Eagles PhD 4-5PM  Registered Dietitian 5-5:30PM   4:00PM-  5:30PM Zoom  Meeting ID: [phone]  Passcode: [phone]  Join Zoom Meeting   January 10, 2025 Wednesday  Presenter:   Powell Eagles PhD 4-5PM  Registered Dietitian 5-5:30PM   4:00PM-  5:30PM Zoom  Meeting ID: [phone]  Passcode: [phone]  Join Zoom Meeting        For any questions, please contact:  [employer] [facility]  [phone]  [email or web address]  Monday - Friday 7am-4:30pm

## 2024-02-15 NOTE — Progress Notes
 BARIATRIC SURGERY LONG TERM POST-OP     DIAGNOSIS: Weight Loss Secondary to Laparoscopic Gastric bypass    HISTORY OF PRESENT ILLNESS: The patient is a 49 y.o. male who was seen for monitoring of weight loss and screening for possible medical complications and nutritional deficiencies. He last underwent a Laparoscopic Gastric bypass 1 years ago.     Measurements in clinic today are:  Wt 254    He has lost 87 pounds since initial visit, with 47 % EWL.      The patient is doing quite well. Denies any significant nausea, vomiting, dysphagia, abdominal pain.    CURRENT POST-OPERATIVE FINDINGS:   Diet: The patient eats 3 meals a day and snacks in between.  Hunger feeling: The patient feels overall less hungry since the surgery  Vitamins: The patient takes multi-vitamins and B complex daily.   Exercise: The patient exercises 3 times a week.  Quality of life: the patient feels the QOL has improved  Co-morbidity: He do not snore anymore.   Patient denies use of NSAIDs, ASA, smoking, and alcohol.    The following items were discussed:   [x]  BMI >=30 kg/m2  [x]  Patient is undergoing MD supervised weight loss  with counseling on reduced-calorie diet, increased physical activity, behavioral modifications >6 months and will continue in program as an adjunct to medication.   []  Tried/failed medications:               []  Orlistat               []  Metformin               []  Phentermine                []  Qsymia               []  Contrave               []  Saxenda  []  Wegovy    [x]  I have evaluated patient and determined no other common endocrine causes of obesity.   [x]  Patient will not be taking more than 1 medication indicated by the FDA for weight loss at a time  [x]  Patient does not have history or family history of MEN or Medullary Thyroid Cancer   [x]  Patient has been counseled regarding the following: 1) Serious safety concerns and common tolerability concerns with the requested product  2) Chronic nature of weight-loss pharmacotherapy, 3) Need for continued calorie reduction and exercise, as able, AND 4) Realistic expectations of weight loss (up to 9.6 - 16% body weight loss possible after at least one (1) year of compliant treatment, in addition to a reduced calorie diet, exercise, and typically, group or individual counseling)       CURRENT MEDICAL CONDITIONS:  Patient Active Problem List   Diagnosis    Central serous chorioretinopathy of left eye    History of laparoscopic adjustable gastric banding    Obesity    Retinopathy    GERD (gastroesophageal reflux disease)        MEDICATIONS:    Current Outpatient Medications:     diclofenac  Sodium 1% gel, Apply 4 g topically four (4) times daily., Disp: 350 g, Rfl: 0    ofloxacin  0.3% ophthalmic solution, Place 1 drop into the left eye four (4) times daily., Disp: 5 mL, Rfl: 0    traMADol  50 mg tablet, Take 1 tablet (50 mg total) by mouth every six (6) hours as needed for Moderate  Pain (Pain Scale 4-6) or Severe Pain (Pain Scale 7-10). Max Daily Amount: 200 mg, Disp: 10 tablet, Rfl: 0        PHYSICAL EXAMINATION:      GENERAL: Well nourished, well groomed in no acute distress.  NEURO: Alert and oriented x3, mood and affect appropriate.  HEENT: Pupils equal. EOMs grossly normal.   LUNGS: Clear to auscultation.  CARDIO: Regular rate and rhythm.  SKIN: Smooth and dry.  ABDOMEN: Incisions well healed. Bowel sounds normal. Soft, non-tender, no masses. No hepatomegaly, no splenomegaly.  EXTREMITIES: Palpable pedal pulses. No venous stasis changes.    LABS:    Pending     ASSESSMENT AND PLAN:   Weight loss secondary to laparoscopic gastric bypass. Recommended to drink at least 64oz of water, and eat 80-100 grams of protein. Recommended cardio exercises and strength exercises for overall health and maximize weight loss.  The current diet and exercise regimens were reviewed in clinic, including protein and average fluid intake.  Recommendations for continued safe weight loss, health and nutrition were discussed in detail. The patient was encouraged to continue diversifying the diet and to maintain and expand the exercise program.   Pt to continue taking multivitamins and  B complex daily. Comprehensive set of labs will be done on the patient and we will adjust supplementation as necessary pending results of the labwork.  Patient reinforced to continue to avoid NSAIDs, ASA, smoking, and alcohol.  I prescribed Zepbound  for him. He will see my NP for dose escalation.     A return appointment will be scheduled in 12 months for his next post-operative visit. Comprehensive labs will be performed at that time. Patient encouraged to call the clinic with any questions or concerns. Patient demonstrates understanding, is amenable to the outlined plan, and there are no barriers to education today.       Modena Door, MD

## 2024-02-16 ENCOUNTER — Telehealth: Payer: BLUE CROSS/BLUE SHIELD

## 2024-02-16 NOTE — Telephone Encounter
 1 mo follow up for wt loss medication management with NP    1st attempt LVM - 02/16/2024

## 2024-03-02 ENCOUNTER — Ambulatory Visit: Payer: Commercial Managed Care - Pharmacy Benefit Manager

## 2024-03-02 MED ORDER — TIRZEPATIDE-WEIGHT MANAGEMENT 2.5 MG/0.5ML SC SOAJ
2.5 mg | SUBCUTANEOUS | 5 refills | 28.00000 days | Status: AC
Start: 2024-03-02 — End: ?

## 2024-03-02 NOTE — Progress Notes
 BARIATRIC SURGERY LONG TERM POST-OP - TELEMEDICINE    Patient Consent to Telehealth Questionnaire       05/02/2018     2:18 PM   [facility] TELEHEALTH PRECHECKIN QUESTIONS   By clicking ''I Agree'', I consent to the below:  I Agree     - I agree  to be treated via a video visit and acknowledge that I may be liable for any relevant copays or coinsurance depending on my insurance plan.  - I understand that this video visit is offered for my convenience and I am able to cancel and reschedule for an in-person appointment if I desire.  - I also acknowledge that sensitive medical information may be discussed during this video visit appointment and that it is my responsibility to locate myself in a location that ensures privacy to my own level of comfort.  - I also acknowledge that I should not be participating in a video visit in a way that could cause danger to myself or to those around me (such as driving or walking).  If my provider is concerned about my safety, I understand that they have the right to terminate the visit.         DIAGNOSIS: Weight Loss Secondary to Laparoscopic Gastric Bypass    HISTORY OF PRESENT ILLNESS: The patient is a 49 y.o. male who was seen for monitoring of weight loss and screening for possible medical complications and nutritional deficiencies. He last underwent a laparoscopic gastric bypass 12 months ago, which was performed on 02/17/2023.       He was recently seen on 02/15/2024. He was prescribed Zepbound  to maximize his weight loss, but this medication is not covered by insurance and too costly. He wants to discuss other weight loss medication alternatives.         CURRENT MEDICAL CONDITIONS:  Patient Active Problem List   Diagnosis    Central serous chorioretinopathy of left eye    History of laparoscopic adjustable gastric banding    Obesity    Retinopathy    GERD (gastroesophageal reflux disease)        MEDICATIONS:    Current Outpatient Medications:     diclofenac  Sodium 1% gel, Apply 4 g topically four (4) times daily., Disp: 350 g, Rfl: 0    ofloxacin  0.3% ophthalmic solution, Place 1 drop into the left eye four (4) times daily., Disp: 5 mL, Rfl: 0    tirzepatide  (ZEPBOUND ) 2.5 mg/0.5 mL SC injection pen, Inject 0.5 mLs (2.5 mg total) under the skin once a week., Disp: 2 mL, Rfl: 2    traMADol  50 mg tablet, Take 1 tablet (50 mg total) by mouth every six (6) hours as needed for Moderate Pain (Pain Scale 4-6) or Severe Pain (Pain Scale 7-10). Max Daily Amount: 200 mg, Disp: 10 tablet, Rfl: 0        PHYSICAL EXAMINATION:     Vitals:    03/02/24 0948   Weight: (!) 252 lb 8 oz (114.5 kg)   Height: 5' 6'' (1.676 m)     Body mass index is 40.75 kg/m?SABRA      GENERAL: Well nourished, well groomed in no acute distress.  NEURO: Alert and oriented x3, mood and affect appropriate.  ABDOMEN: Incisions appear closely approximated without evidence of dehiscence, discharge, or significant erythema. Incisions appear dry.  LUNGS: Patient breathes without difficulty         ASSESSMENT AND PLAN:   We will send the Zepbound  to [facility] and  he will pay out of pocket.     Patient to be seen again in 3 months for his next post-operative visit. Comprehensive labs will be performed at that time. Patient encouraged to call the clinic with any questions or concerns. Patient demonstrates understanding, is amenable to the outlined plan, and there are no barriers to education today.
